# Patient Record
Sex: Male | Born: 1950
Health system: Southern US, Community
[De-identification: ages and names within clinical notes are randomized; demographics above are authoritative.]

## PROBLEM LIST (undated history)

## (undated) DIAGNOSIS — I10 Essential (primary) hypertension: Secondary | ICD-10-CM

## (undated) DIAGNOSIS — R972 Elevated prostate specific antigen [PSA]: Secondary | ICD-10-CM

## (undated) DIAGNOSIS — E7800 Pure hypercholesterolemia, unspecified: Secondary | ICD-10-CM

## (undated) DIAGNOSIS — M1990 Unspecified osteoarthritis, unspecified site: Secondary | ICD-10-CM

## (undated) DIAGNOSIS — C61 Malignant neoplasm of prostate: Secondary | ICD-10-CM

## (undated) DIAGNOSIS — J45909 Unspecified asthma, uncomplicated: Secondary | ICD-10-CM

## (undated) DIAGNOSIS — E781 Pure hyperglyceridemia: Secondary | ICD-10-CM

## (undated) HISTORY — PX: KNEE ARTHROSCOPY: SUR90

## (undated) HISTORY — DX: Pure hyperglyceridemia: E78.1

## (undated) HISTORY — DX: Essential (primary) hypertension: I10

## (undated) HISTORY — PX: CYST EXCISION: SHX5701

## (undated) HISTORY — PX: SHOULDER ARTHROSCOPY: SHX128

## (undated) HISTORY — PX: PROSTATE BIOPSY: SHX241

## (undated) HISTORY — PX: COLONOSCOPY: SHX174

---

## 2006-05-15 ENCOUNTER — Ambulatory Visit: Payer: Self-pay | Admitting: Hematology & Oncology

## 2016-09-26 ENCOUNTER — Ambulatory Visit (INDEPENDENT_AMBULATORY_CARE_PROVIDER_SITE_OTHER): Payer: Managed Care, Other (non HMO)

## 2016-09-26 ENCOUNTER — Other Ambulatory Visit: Payer: Self-pay | Admitting: Family Medicine

## 2016-09-26 DIAGNOSIS — M25551 Pain in right hip: Secondary | ICD-10-CM

## 2016-09-26 DIAGNOSIS — M11261 Other chondrocalcinosis, right knee: Secondary | ICD-10-CM | POA: Diagnosis not present

## 2016-09-26 DIAGNOSIS — I2510 Atherosclerotic heart disease of native coronary artery without angina pectoris: Secondary | ICD-10-CM

## 2016-09-26 DIAGNOSIS — I739 Peripheral vascular disease, unspecified: Secondary | ICD-10-CM | POA: Diagnosis not present

## 2016-09-26 DIAGNOSIS — M2341 Loose body in knee, right knee: Secondary | ICD-10-CM

## 2017-03-21 DIAGNOSIS — E669 Obesity, unspecified: Secondary | ICD-10-CM | POA: Diagnosis not present

## 2017-03-21 DIAGNOSIS — Z0000 Encounter for general adult medical examination without abnormal findings: Secondary | ICD-10-CM | POA: Diagnosis not present

## 2017-03-21 DIAGNOSIS — E782 Mixed hyperlipidemia: Secondary | ICD-10-CM | POA: Diagnosis not present

## 2017-03-21 DIAGNOSIS — Z1159 Encounter for screening for other viral diseases: Secondary | ICD-10-CM | POA: Diagnosis not present

## 2017-03-21 DIAGNOSIS — I10 Essential (primary) hypertension: Secondary | ICD-10-CM | POA: Diagnosis not present

## 2017-03-21 DIAGNOSIS — G4700 Insomnia, unspecified: Secondary | ICD-10-CM | POA: Diagnosis not present

## 2017-03-21 DIAGNOSIS — Z125 Encounter for screening for malignant neoplasm of prostate: Secondary | ICD-10-CM | POA: Diagnosis not present

## 2017-03-21 DIAGNOSIS — R7303 Prediabetes: Secondary | ICD-10-CM | POA: Diagnosis not present

## 2017-03-21 DIAGNOSIS — I700 Atherosclerosis of aorta: Secondary | ICD-10-CM | POA: Diagnosis not present

## 2017-03-21 DIAGNOSIS — Z23 Encounter for immunization: Secondary | ICD-10-CM | POA: Diagnosis not present

## 2017-03-21 DIAGNOSIS — J45909 Unspecified asthma, uncomplicated: Secondary | ICD-10-CM | POA: Diagnosis not present

## 2017-03-21 DIAGNOSIS — R809 Proteinuria, unspecified: Secondary | ICD-10-CM | POA: Diagnosis not present

## 2017-04-19 DIAGNOSIS — M25561 Pain in right knee: Secondary | ICD-10-CM | POA: Diagnosis not present

## 2017-05-10 DIAGNOSIS — R21 Rash and other nonspecific skin eruption: Secondary | ICD-10-CM | POA: Diagnosis not present

## 2017-09-25 DIAGNOSIS — J45909 Unspecified asthma, uncomplicated: Secondary | ICD-10-CM | POA: Diagnosis not present

## 2017-09-25 DIAGNOSIS — M2550 Pain in unspecified joint: Secondary | ICD-10-CM | POA: Diagnosis not present

## 2017-09-25 DIAGNOSIS — G4700 Insomnia, unspecified: Secondary | ICD-10-CM | POA: Diagnosis not present

## 2017-09-25 DIAGNOSIS — E669 Obesity, unspecified: Secondary | ICD-10-CM | POA: Diagnosis not present

## 2017-09-25 DIAGNOSIS — E782 Mixed hyperlipidemia: Secondary | ICD-10-CM | POA: Diagnosis not present

## 2017-09-25 DIAGNOSIS — I10 Essential (primary) hypertension: Secondary | ICD-10-CM | POA: Diagnosis not present

## 2017-09-25 DIAGNOSIS — R809 Proteinuria, unspecified: Secondary | ICD-10-CM | POA: Diagnosis not present

## 2017-11-16 ENCOUNTER — Encounter: Payer: Self-pay | Admitting: Endocrinology

## 2017-11-16 ENCOUNTER — Ambulatory Visit (INDEPENDENT_AMBULATORY_CARE_PROVIDER_SITE_OTHER): Payer: Medicare Other | Admitting: Endocrinology

## 2017-11-16 DIAGNOSIS — E781 Pure hyperglyceridemia: Secondary | ICD-10-CM | POA: Diagnosis not present

## 2017-11-16 DIAGNOSIS — I10 Essential (primary) hypertension: Secondary | ICD-10-CM

## 2017-11-16 MED ORDER — ATENOLOL 25 MG PO TABS: 25.0000 mg | ORAL_TABLET | Freq: Every day | ORAL | 3 refills | Status: DC

## 2017-11-16 MED ORDER — OLMESARTAN MEDOXOMIL 20 MG PO TABS: 20.0000 mg | ORAL_TABLET | Freq: Every day | ORAL | 11 refills | Status: DC

## 2017-11-16 NOTE — Patient Instructions (Addendum)
I have sent a prescription to your pharmacy, to reduce the atenolol, and add "olmesartan."   Please come back for a follow-up appointment in 1 month, fasting.       Food Choices to Lower Your Triglycerides Triglycerides are a type of fat in your blood. High levels of triglycerides can increase the risk of heart disease and stroke. If your triglyceride levels are high, the foods you eat and your eating habits are very important. Choosing the right foods can help lower your triglycerides. What general guidelines do I need to follow?  Lose weight if you are overweight.  Limit or avoid alcohol.  Fill one half of your plate with vegetables and green salads.  Limit fruit to two servings a day. Choose fruit instead of juice.  Make one fourth of your plate whole grains. Look for the word "whole" as the first word in the ingredient list.  Fill one fourth of your plate with lean protein foods.  Enjoy fatty fish (such as salmon, mackerel, sardines, and tuna) three times a week.  Choose healthy fats.  Limit foods high in starch and sugar.  Eat more home-cooked food and less restaurant, buffet, and fast food.  Limit fried foods.  Cook foods using methods other than frying.  Limit saturated fats.  Check ingredient lists to avoid foods with partially hydrogenated oils (trans fats) in them. What foods can I eat? Grains Whole grains, such as whole wheat or whole grain breads, crackers, cereals, and pasta. Unsweetened oatmeal, bulgur, barley, quinoa, or brown rice. Corn or whole wheat flour tortillas. Vegetables Fresh or frozen vegetables (raw, steamed, roasted, or grilled). Green salads. Fruits All fresh, canned (in natural juice), or frozen fruits. Meat and Other Protein Products Ground beef (85% or leaner), grass-fed beef, or beef trimmed of fat. Skinless chicken or [place]. Ground chicken or [place]. Pork trimmed of fat. All fish and seafood. Eggs. Dried beans, peas, or lentils. Unsalted  nuts or seeds. Unsalted canned or dry beans. Dairy Low-fat dairy products, such as skim or 1% milk, 2% or reduced-fat cheeses, low-fat ricotta or cottage cheese, or plain low-fat yogurt. Fats and Oils Tub margarines without trans fats. Light or reduced-fat mayonnaise and salad dressings. Avocado. Safflower, olive, or canola oils. Natural peanut or almond butter. The items listed above may not be a complete list of recommended foods or beverages. Contact your dietitian for more options. What foods are not recommended? Grains White bread. White pasta. White rice. Cornbread. Bagels, pastries, and croissants. Crackers that contain trans fat. Vegetables White potatoes. Corn. Creamed or fried vegetables. Vegetables in a cheese sauce. Fruits Dried fruits. Canned fruit in light or heavy syrup. Fruit juice. Meat and Other Protein Products Fatty cuts of meat. Ribs, chicken wings, bacon, sausage, bologna, salami, chitterlings, fatback, hot dogs, bratwurst, and packaged luncheon meats. Dairy Whole or 2% milk, cream, half-and-half, and cream cheese. Whole-fat or sweetened yogurt. Full-fat cheeses. Nondairy creamers and whipped toppings. Processed cheese, cheese spreads, or cheese curds. Sweets and Desserts Corn syrup, sugars, honey, and molasses. Candy. Jam and jelly. Syrup. Sweetened cereals. Cookies, pies, cakes, donuts, muffins, and ice cream. Fats and Oils Butter, stick margarine, lard, shortening, ghee, or bacon fat. Coconut, palm kernel, or palm oils. Beverages Alcohol. Sweetened drinks (such as sodas, lemonade, and fruit drinks or punches). The items listed above may not be a complete list of foods and beverages to avoid. Contact your dietitian for more information. This information is not intended to replace advice given to you by  your health care provider. Make sure you discuss any questions you have with your health care provider. Document Released: 08/11/2004 Document Revised: 03/31/2016  Document Reviewed: 08/28/2013 Elsevier Interactive Patient Education  2017 [employer].

## 2017-11-16 NOTE — Progress Notes (Signed)
Subjective:    Patient ID: Leonard Burke, male    DOB: 03-10-1951, 67 y.o.   MRN: [id number]  HPI Pt is a new pt, for hypertriglyceridemia.  This was dx'ed in approx 1985.  He denies h/o pancreatitis, diabetes, steroids, alcoholism, HIV, SLE, retin-A, or thyroid problems.  He has proteinuria.  He takes fenofibrate, and fish oil. He has moderate arthralgias throughout the body, but no assoc rash.  He did not tolerate non-prescription niacin, due to "burning of the skin."  Pt says Dr Doyle Askew tried to change BP rx for losartan, but he did not tolerate.   Past Medical History:  Diagnosis Date  . HTN (hypertension)   . Hypertriglyceridemia      Social History   Socioeconomic History  . Marital status: Married    Spouse name: Not on file  . Number of children: Not on file  . Years of education: Not on file  . Highest education level: Not on file  Social Needs  . Financial resource strain: Not on file  . Food insecurity - worry: Not on file  . Food insecurity - inability: Not on file  . Transportation needs - medical: Not on file  . Transportation needs - non-medical: Not on file  Occupational History  . Not on file  Tobacco Use  . Smoking status: Former Smoker    Types: Cigarettes  . Smokeless tobacco: Never Used  Substance and Sexual Activity  . Alcohol use: Yes    Frequency: Never    Comment: Occasional  . Drug use: No  . Sexual activity: No  Other Topics Concern  . Not on file  Social History Narrative  . Not on file    Current Outpatient Medications on File Prior to Visit  Medication Sig Dispense Refill  . amLODipine (NORVASC) 10 MG tablet Take 10 mg by mouth daily.    . Choline Fenofibrate (FENOFIBRIC ACID) 135 MG CPDR Take 135 mg by mouth daily.    . hydrochlorothiazide (MICROZIDE) 12.5 MG capsule Take 12.5 mg by mouth daily.  0  . VENTOLIN HFA 108 (90 Base) MCG/ACT inhaler Inhale 90 mcg into the lungs as needed.  1   No current facility-administered  medications on file prior to visit.     Allergies  Allergen Reactions  . Codeine Nausea And Vomiting  . Penicillins Rash    Family History  Problem Relation Age of Onset  . Other Neg Hx        hypertriglyceridemia    BP 126/72 (BP Location: Left Arm, Patient Position: Sitting, Cuff Size: Normal)   Pulse 65   Wt 238 lb 9.6 oz (108.2 kg)   SpO2 96%    Review of Systems denies weight loss, blurry vision, headache, chest pain, sob, n/v, urinary frequency, muscle cramps, excessive diaphoresis, memory loss, depression, cold intolerance, rhinorrhea, and easy bruising.      Objective:   Physical Exam VS: see vs page GEN: no distress HEAD: head: no deformity eyes: no periorbital swelling, no proptosis external nose and ears are normal mouth: no lesion seen NECK: supple, thyroid is not enlarged CHEST WALL: no deformity LUNGS: clear to auscultation CV: reg rate and rhythm, no murmur ABD: abdomen is soft, nontender.  no hepatosplenomegaly.  not distended.  no hernia MUSCULOSKELETAL: muscle bulk and strength are grossly normal.  no obvious joint swelling.  gait is normal and steady EXTEMITIES: no deformity.  1+ bilat leg edema PULSES: no carotid bruit NEURO:  cn 2-12 grossly intact.  readily moves all 4's.  sensation is intact to touch on all 4's SKIN:  Normal texture and temperature.  No rash or suspicious lesion is visible.  No cutaneous stigmata are seen NODES:  None palpable at the neck.  PSYCH: alert, well-oriented.  Does not appear anxious nor depressed.   I have reviewed outside records, and summarized: Pt was noted to have elevated TG, and referred here.  He takes several BP meds which affect TG and edema, but no alternative was available.    outside test results are reviewed: TG=1581 LDL=56    Assessment & Plan:  Hypertriglyceridemia, new to me. We discussed.  He says he dopes not have time to see a dietician Obesity: he declines surgery. HTN: Alternatives to  B-blocker and HCTZ should be considered if possible.   Edema: this is a relative contraindication to pioglitizone.  Patient Instructions  I have sent a prescription to your pharmacy, to reduce the atenolol, and add "olmesartan."   Please come back for a follow-up appointment in 1 month, fasting.       Food Choices to Lower Your Triglycerides Triglycerides are a type of fat in your blood. High levels of triglycerides can increase the risk of heart disease and stroke. If your triglyceride levels are high, the foods you eat and your eating habits are very important. Choosing the right foods can help lower your triglycerides. What general guidelines do I need to follow?  Lose weight if you are overweight.  Limit or avoid alcohol.  Fill one half of your plate with vegetables and green salads.  Limit fruit to two servings a day. Choose fruit instead of juice.  Make one fourth of your plate whole grains. Look for the word "whole" as the first word in the ingredient list.  Fill one fourth of your plate with lean protein foods.  Enjoy fatty fish (such as salmon, mackerel, sardines, and tuna) three times a week.  Choose healthy fats.  Limit foods high in starch and sugar.  Eat more home-cooked food and less restaurant, buffet, and fast food.  Limit fried foods.  Cook foods using methods other than frying.  Limit saturated fats.  Check ingredient lists to avoid foods with partially hydrogenated oils (trans fats) in them. What foods can I eat? Grains Whole grains, such as whole wheat or whole grain breads, crackers, cereals, and pasta. Unsweetened oatmeal, bulgur, barley, quinoa, or brown rice. Corn or whole wheat flour tortillas. Vegetables Fresh or frozen vegetables (raw, steamed, roasted, or grilled). Green salads. Fruits All fresh, canned (in natural juice), or frozen fruits. Meat and Other Protein Products Ground beef (85% or leaner), grass-fed beef, or beef trimmed of fat.  Skinless chicken or [place]. Ground chicken or [place]. Pork trimmed of fat. All fish and seafood. Eggs. Dried beans, peas, or lentils. Unsalted nuts or seeds. Unsalted canned or dry beans. Dairy Low-fat dairy products, such as skim or 1% milk, 2% or reduced-fat cheeses, low-fat ricotta or cottage cheese, or plain low-fat yogurt. Fats and Oils Tub margarines without trans fats. Light or reduced-fat mayonnaise and salad dressings. Avocado. Safflower, olive, or canola oils. Natural peanut or almond butter. The items listed above may not be a complete list of recommended foods or beverages. Contact your dietitian for more options. What foods are not recommended? Grains White bread. White pasta. White rice. Cornbread. Bagels, pastries, and croissants. Crackers that contain trans fat. Vegetables White potatoes. Corn. Creamed or fried vegetables. Vegetables in a cheese sauce. Fruits Dried fruits.  Canned fruit in light or heavy syrup. Fruit juice. Meat and Other Protein Products Fatty cuts of meat. Ribs, chicken wings, bacon, sausage, bologna, salami, chitterlings, fatback, hot dogs, bratwurst, and packaged luncheon meats. Dairy Whole or 2% milk, cream, half-and-half, and cream cheese. Whole-fat or sweetened yogurt. Full-fat cheeses. Nondairy creamers and whipped toppings. Processed cheese, cheese spreads, or cheese curds. Sweets and Desserts Corn syrup, sugars, honey, and molasses. Candy. Jam and jelly. Syrup. Sweetened cereals. Cookies, pies, cakes, donuts, muffins, and ice cream. Fats and Oils Butter, stick margarine, lard, shortening, ghee, or bacon fat. Coconut, palm kernel, or palm oils. Beverages Alcohol. Sweetened drinks (such as sodas, lemonade, and fruit drinks or punches). The items listed above may not be a complete list of foods and beverages to avoid. Contact your dietitian for more information. This information is not intended to replace advice given to you by your health care provider.  Make sure you discuss any questions you have with your health care provider. Document Released: 08/11/2004 Document Revised: 03/31/2016 Document Reviewed: 08/28/2013 Elsevier Interactive Patient Education  2017 [employer].

## 2017-11-18 ENCOUNTER — Encounter: Payer: Self-pay | Admitting: Endocrinology

## 2017-11-18 DIAGNOSIS — I10 Essential (primary) hypertension: Secondary | ICD-10-CM | POA: Insufficient documentation

## 2017-11-18 DIAGNOSIS — E781 Pure hyperglyceridemia: Secondary | ICD-10-CM | POA: Insufficient documentation

## 2017-12-05 ENCOUNTER — Telehealth: Payer: Self-pay | Admitting: Endocrinology

## 2017-12-05 NOTE — Telephone Encounter (Signed)
LM for pt to get a PCP MD on file and the referring MD on file.

## 2017-12-05 NOTE — Telephone Encounter (Signed)
-----   Message from Renato Shin, MD sent at 11/18/2017  5:30 PM EST ----- I can't find a ref or PCP, thanks.

## 2017-12-18 ENCOUNTER — Encounter: Payer: Self-pay | Admitting: Endocrinology

## 2017-12-18 ENCOUNTER — Ambulatory Visit (INDEPENDENT_AMBULATORY_CARE_PROVIDER_SITE_OTHER): Payer: Medicare Other | Admitting: Endocrinology

## 2017-12-18 VITALS — BP 122/71 | HR 62 | Wt 235.6 lb

## 2017-12-18 DIAGNOSIS — E781 Pure hyperglyceridemia: Secondary | ICD-10-CM

## 2017-12-18 DIAGNOSIS — M542 Cervicalgia: Secondary | ICD-10-CM | POA: Diagnosis not present

## 2017-12-18 DIAGNOSIS — M25512 Pain in left shoulder: Secondary | ICD-10-CM | POA: Diagnosis not present

## 2017-12-18 LAB — TSH: TSH: 4.28 u[IU]/mL (ref 0.35–4.50)

## 2017-12-18 LAB — LDL CHOLESTEROL, DIRECT: Direct LDL: 88 mg/dL

## 2017-12-18 LAB — LIPID PANEL
CHOL/HDL RATIO: 9
CHOLESTEROL: 215 mg/dL — AB (ref 0–200)
HDL: 24.2 mg/dL — AB (ref >39.00)

## 2017-12-18 MED ORDER — ICOSAPENT ETHYL 1 G PO CAPS: 2.0000 g | ORAL_CAPSULE | Freq: Every day | ORAL | 11 refills | Status: DC

## 2017-12-18 NOTE — Patient Instructions (Addendum)
Please verify that this medication list is correct.  blood tests are requested for you today.  We'll let you know about the results. If we need to, we can add "Vascepa."

## 2017-12-18 NOTE — Progress Notes (Signed)
Subjective:    Patient ID: Leonard Burke, male    DOB: Mar 06, 1951, 67 y.o.   MRN: [id number]  HPI Pt returns for f/u of hypertriglyceridemia (dx'ed 1985; he has no h/o pancreatitis or ASCVD; only causes found were obesity (declines surgery) and BP meds; he did not tolerate non-prescription niacin; edema is a relative contraindication to pioglitizone; he takes fenofibrate).  He is uncertain if he made changed in BP meds made at last ov here. Past Medical History:  Diagnosis Date  . HTN (hypertension)   . Hypertriglyceridemia     History reviewed. No pertinent surgical history.  Social History   Socioeconomic History  . Marital status: Married    Spouse name: Not on file  . Number of children: Not on file  . Years of education: Not on file  . Highest education level: Not on file  Social Needs  . Financial resource strain: Not on file  . Food insecurity - worry: Not on file  . Food insecurity - inability: Not on file  . Transportation needs - medical: Not on file  . Transportation needs - non-medical: Not on file  Occupational History  . Not on file  Tobacco Use  . Smoking status: Former Smoker    Types: Cigarettes  . Smokeless tobacco: Never Used  Substance and Sexual Activity  . Alcohol use: Yes    Frequency: Never    Comment: Occasional  . Drug use: No  . Sexual activity: No  Other Topics Concern  . Not on file  Social History Narrative  . Not on file    Current Outpatient Medications on File Prior to Visit  Medication Sig Dispense Refill  . amLODipine (NORVASC) 10 MG tablet Take 10 mg by mouth daily.    Marland Kitchen atenolol (TENORMIN) 25 MG tablet Take 1 tablet (25 mg total) by mouth daily. 30 tablet 3  . Choline Fenofibrate (FENOFIBRIC ACID) 135 MG CPDR Take 135 mg by mouth daily.    . hydrochlorothiazide (MICROZIDE) 12.5 MG capsule Take 12.5 mg by mouth daily.  0  . olmesartan (BENICAR) 20 MG tablet Take 1 tablet (20 mg total) by mouth daily. 30 tablet 11  .  VENTOLIN HFA 108 (90 Base) MCG/ACT inhaler Inhale 90 mcg into the lungs as needed.  1   No current facility-administered medications on file prior to visit.     Allergies  Allergen Reactions  . Codeine Nausea And Vomiting  . Penicillins Rash    Family History  Problem Relation Age of Onset  . Other Neg Hx        hypertriglyceridemia    BP 122/71 (BP Location: Left Arm, Patient Position: Sitting, Cuff Size: Normal)   Pulse 62   Wt 235 lb 9.6 oz (106.9 kg)   SpO2 95%    Review of Systems Denies dizziness.     Objective:   Physical Exam VITAL SIGNS:  See vs page GENERAL: no distress Ext: 1+ bilat leg edema.    Lab Results  Component Value Date   CHOL 215 (H) 12/18/2017   HDL 24.20 (L) 12/18/2017   LDLDIRECT 88.0 12/18/2017   TRIG (H) 12/18/2017    616.0 Triglyceride is over 400; calculations on Lipids are invalid.   CHOLHDL 9 12/18/2017      Assessment & Plan:  Hypertriglyceridemia, improved but persistent HTN: well-controlled, but pt is uncertain about his meds.  Patient Instructions  Please verify that this medication list is correct.  blood tests are requested for you today.  We'll let you know about the results. If we need to, we can add "Vascepa."

## 2017-12-19 ENCOUNTER — Telehealth: Payer: Self-pay | Admitting: Endocrinology

## 2017-12-19 NOTE — Telephone Encounter (Signed)
I called patient LVM informing him of lab results & to call back to make one month lab appt for fasting labs.

## 2017-12-19 NOTE — Telephone Encounter (Signed)
Pt is returning call about blood test.   pt said okay to leave message if he does not answer,

## 2018-01-19 ENCOUNTER — Other Ambulatory Visit: Payer: Self-pay

## 2018-01-24 ENCOUNTER — Telehealth: Payer: Self-pay | Admitting: Endocrinology

## 2018-01-24 ENCOUNTER — Other Ambulatory Visit (INDEPENDENT_AMBULATORY_CARE_PROVIDER_SITE_OTHER): Payer: Medicare Other

## 2018-01-24 DIAGNOSIS — E781 Pure hyperglyceridemia: Secondary | ICD-10-CM | POA: Diagnosis not present

## 2018-01-24 LAB — LIPID PANEL
Cholesterol: 220 mg/dL — ABNORMAL HIGH (ref 0–200)
HDL: 30.2 mg/dL — AB (ref >39.00)
Total CHOL/HDL Ratio: 7

## 2018-01-24 NOTE — Telephone Encounter (Signed)
Patient states insurance has changed-Medication Fenofibric Acid has gone up to $112.00 for 30 pills. Is there a generic or something similar that can be prescribed for him? Pharmacy is [facility] in [place]. Please contact patient to let him know.

## 2018-01-24 NOTE — Telephone Encounter (Signed)
See message and please advise, Thanks!  

## 2018-01-24 NOTE — Telephone Encounter (Signed)
there are many strengths of fenofibrate.  I just need to know which medicare prefers?

## 2018-01-25 ENCOUNTER — Other Ambulatory Visit: Payer: Self-pay | Admitting: Endocrinology

## 2018-01-25 DIAGNOSIS — E781 Pure hyperglyceridemia: Secondary | ICD-10-CM

## 2018-01-25 LAB — LDL CHOLESTEROL, DIRECT: Direct LDL: 94 mg/dL

## 2018-01-25 MED ORDER — FENOFIBRATE 145 MG PO TABS: 145.0000 mg | ORAL_TABLET | Freq: Every day | ORAL | 3 refills | Status: AC

## 2018-01-25 NOTE — Telephone Encounter (Signed)
Ok, I have sent a prescription to your pharmacy 

## 2018-01-25 NOTE — Telephone Encounter (Signed)
fenofibrate oral tablet 145 mg, 48 mg Tier 2 RM-Retail or Mail Order Pharmacy fenofibric acid oral capsule delayed release 135 mg, 45 mg  Tier 2 RM-Retail or Mail Order Pharmacy  This is from the 2019 Medicare Formulary

## 2018-02-28 ENCOUNTER — Other Ambulatory Visit: Payer: Medicare Other

## 2018-03-28 DIAGNOSIS — Z0000 Encounter for general adult medical examination without abnormal findings: Secondary | ICD-10-CM | POA: Diagnosis not present

## 2018-03-28 DIAGNOSIS — Z125 Encounter for screening for malignant neoplasm of prostate: Secondary | ICD-10-CM | POA: Diagnosis not present

## 2018-03-28 DIAGNOSIS — G4700 Insomnia, unspecified: Secondary | ICD-10-CM | POA: Diagnosis not present

## 2018-03-28 DIAGNOSIS — I10 Essential (primary) hypertension: Secondary | ICD-10-CM | POA: Diagnosis not present

## 2018-03-28 DIAGNOSIS — J45909 Unspecified asthma, uncomplicated: Secondary | ICD-10-CM | POA: Diagnosis not present

## 2018-03-28 DIAGNOSIS — E781 Pure hyperglyceridemia: Secondary | ICD-10-CM | POA: Diagnosis not present

## 2018-03-28 DIAGNOSIS — R809 Proteinuria, unspecified: Secondary | ICD-10-CM | POA: Diagnosis not present

## 2018-03-28 DIAGNOSIS — E782 Mixed hyperlipidemia: Secondary | ICD-10-CM | POA: Diagnosis not present

## 2018-03-28 DIAGNOSIS — Z6834 Body mass index (BMI) 34.0-34.9, adult: Secondary | ICD-10-CM | POA: Diagnosis not present

## 2018-03-28 DIAGNOSIS — E669 Obesity, unspecified: Secondary | ICD-10-CM | POA: Diagnosis not present

## 2018-05-09 DIAGNOSIS — M545 Low back pain: Secondary | ICD-10-CM | POA: Diagnosis not present

## 2018-05-09 DIAGNOSIS — M7582 Other shoulder lesions, left shoulder: Secondary | ICD-10-CM | POA: Diagnosis not present

## 2018-05-09 DIAGNOSIS — Z6836 Body mass index (BMI) 36.0-36.9, adult: Secondary | ICD-10-CM | POA: Diagnosis not present

## 2018-05-14 ENCOUNTER — Other Ambulatory Visit: Payer: Self-pay | Admitting: Endocrinology

## 2018-05-29 ENCOUNTER — Other Ambulatory Visit (INDEPENDENT_AMBULATORY_CARE_PROVIDER_SITE_OTHER): Payer: Medicare Other

## 2018-05-29 DIAGNOSIS — E781 Pure hyperglyceridemia: Secondary | ICD-10-CM

## 2018-05-29 DIAGNOSIS — H9113 Presbycusis, bilateral: Secondary | ICD-10-CM | POA: Diagnosis not present

## 2018-05-29 DIAGNOSIS — H9313 Tinnitus, bilateral: Secondary | ICD-10-CM | POA: Diagnosis not present

## 2018-05-29 DIAGNOSIS — H903 Sensorineural hearing loss, bilateral: Secondary | ICD-10-CM | POA: Diagnosis not present

## 2018-05-29 DIAGNOSIS — H833X3 Noise effects on inner ear, bilateral: Secondary | ICD-10-CM | POA: Diagnosis not present

## 2018-05-29 LAB — LIPID PANEL
CHOL/HDL RATIO: 7
CHOLESTEROL: 209 mg/dL — AB (ref 0–200)
HDL: 31.2 mg/dL — ABNORMAL LOW (ref >39.00)
NonHDL: 178.01
Triglycerides: 330 mg/dL — ABNORMAL HIGH (ref 0.0–149.0)
VLDL: 66 mg/dL — AB (ref 0.0–40.0)

## 2018-05-29 LAB — LDL CHOLESTEROL, DIRECT: Direct LDL: 119 mg/dL

## 2018-06-19 ENCOUNTER — Telehealth: Payer: Self-pay | Admitting: Endocrinology

## 2018-06-19 NOTE — Telephone Encounter (Signed)
Please advise on below  

## 2018-06-19 NOTE — Telephone Encounter (Signed)
Aida [phone] called from [facility] regarding pt stating that Dr Loanne Drilling suggested to have Dr Olen Pel reduse pt BP meds. The suggestion if so needs to be put in a letter. Medication is atenolol (TENORMIN) 25 MG tablet and hydrochlorothiazide (MICROZIDE) 12.5 MG capsule.  Please fax to [phone]. Thanks

## 2018-06-20 NOTE — Telephone Encounter (Signed)
done

## 2018-07-05 DIAGNOSIS — D485 Neoplasm of uncertain behavior of skin: Secondary | ICD-10-CM | POA: Diagnosis not present

## 2018-07-19 DIAGNOSIS — L570 Actinic keratosis: Secondary | ICD-10-CM | POA: Diagnosis not present

## 2018-08-16 ENCOUNTER — Other Ambulatory Visit: Payer: Self-pay | Admitting: Endocrinology

## 2018-08-26 DIAGNOSIS — Z23 Encounter for immunization: Secondary | ICD-10-CM | POA: Diagnosis not present

## 2018-08-28 DIAGNOSIS — L570 Actinic keratosis: Secondary | ICD-10-CM | POA: Diagnosis not present

## 2018-09-01 DIAGNOSIS — M1712 Unilateral primary osteoarthritis, left knee: Secondary | ICD-10-CM | POA: Diagnosis not present

## 2018-09-25 DIAGNOSIS — L570 Actinic keratosis: Secondary | ICD-10-CM | POA: Diagnosis not present

## 2018-09-28 DIAGNOSIS — I700 Atherosclerosis of aorta: Secondary | ICD-10-CM | POA: Diagnosis not present

## 2018-09-28 DIAGNOSIS — G4700 Insomnia, unspecified: Secondary | ICD-10-CM | POA: Diagnosis not present

## 2018-09-28 DIAGNOSIS — E782 Mixed hyperlipidemia: Secondary | ICD-10-CM | POA: Diagnosis not present

## 2018-09-28 DIAGNOSIS — R809 Proteinuria, unspecified: Secondary | ICD-10-CM | POA: Diagnosis not present

## 2018-09-28 DIAGNOSIS — J45909 Unspecified asthma, uncomplicated: Secondary | ICD-10-CM | POA: Diagnosis not present

## 2018-09-28 DIAGNOSIS — E669 Obesity, unspecified: Secondary | ICD-10-CM | POA: Diagnosis not present

## 2018-09-28 DIAGNOSIS — I10 Essential (primary) hypertension: Secondary | ICD-10-CM | POA: Diagnosis not present

## 2018-11-13 DIAGNOSIS — M1712 Unilateral primary osteoarthritis, left knee: Secondary | ICD-10-CM | POA: Diagnosis not present

## 2018-11-13 DIAGNOSIS — M25512 Pain in left shoulder: Secondary | ICD-10-CM | POA: Diagnosis not present

## 2018-11-23 ENCOUNTER — Encounter (HOSPITAL_BASED_OUTPATIENT_CLINIC_OR_DEPARTMENT_OTHER): Payer: Self-pay

## 2018-11-23 ENCOUNTER — Other Ambulatory Visit: Payer: Self-pay

## 2018-11-23 ENCOUNTER — Emergency Department (HOSPITAL_BASED_OUTPATIENT_CLINIC_OR_DEPARTMENT_OTHER)
Admission: EM | Admit: 2018-11-23 | Discharge: 2018-11-23 | Disposition: A | Discharge status: Home / Self Care | Payer: No Typology Code available for payment source | Attending: Emergency Medicine | Admitting: Emergency Medicine

## 2018-11-23 ENCOUNTER — Emergency Department (HOSPITAL_BASED_OUTPATIENT_CLINIC_OR_DEPARTMENT_OTHER): Payer: No Typology Code available for payment source

## 2018-11-23 DIAGNOSIS — Z79899 Other long term (current) drug therapy: Secondary | ICD-10-CM | POA: Diagnosis not present

## 2018-11-23 DIAGNOSIS — I10 Essential (primary) hypertension: Secondary | ICD-10-CM | POA: Diagnosis not present

## 2018-11-23 DIAGNOSIS — Z87891 Personal history of nicotine dependence: Secondary | ICD-10-CM | POA: Insufficient documentation

## 2018-11-23 DIAGNOSIS — M542 Cervicalgia: Secondary | ICD-10-CM | POA: Diagnosis not present

## 2018-11-23 DIAGNOSIS — S199XXA Unspecified injury of neck, initial encounter: Secondary | ICD-10-CM | POA: Diagnosis not present

## 2018-11-23 DIAGNOSIS — Z23 Encounter for immunization: Secondary | ICD-10-CM | POA: Insufficient documentation

## 2018-11-23 DIAGNOSIS — R040 Epistaxis: Secondary | ICD-10-CM | POA: Insufficient documentation

## 2018-11-23 DIAGNOSIS — S0990XA Unspecified injury of head, initial encounter: Secondary | ICD-10-CM | POA: Diagnosis not present

## 2018-11-23 MED ORDER — TETANUS-DIPHTH-ACELL PERTUSSIS 5-2.5-18.5 LF-MCG/0.5 IM SUSP
0.5000 mL | Freq: Once | INTRAMUSCULAR | Status: AC
  Administered 2018-11-23: 0.5 mL via INTRAMUSCULAR
  Filled 2018-11-23: qty 0.5
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
## 2019-03-28 DIAGNOSIS — M170 Bilateral primary osteoarthritis of knee: Secondary | ICD-10-CM | POA: Diagnosis not present

## 2019-04-08 DIAGNOSIS — M170 Bilateral primary osteoarthritis of knee: Secondary | ICD-10-CM | POA: Diagnosis not present

## 2019-04-15 DIAGNOSIS — M170 Bilateral primary osteoarthritis of knee: Secondary | ICD-10-CM | POA: Diagnosis not present

## 2019-06-01 DIAGNOSIS — M9903 Segmental and somatic dysfunction of lumbar region: Secondary | ICD-10-CM | POA: Diagnosis not present

## 2019-06-01 DIAGNOSIS — M5136 Other intervertebral disc degeneration, lumbar region: Secondary | ICD-10-CM | POA: Diagnosis not present

## 2019-06-01 DIAGNOSIS — M5135 Other intervertebral disc degeneration, thoracolumbar region: Secondary | ICD-10-CM | POA: Diagnosis not present

## 2019-06-01 DIAGNOSIS — M9902 Segmental and somatic dysfunction of thoracic region: Secondary | ICD-10-CM | POA: Diagnosis not present

## 2019-06-01 DIAGNOSIS — M9904 Segmental and somatic dysfunction of sacral region: Secondary | ICD-10-CM | POA: Diagnosis not present

## 2019-06-01 DIAGNOSIS — M5137 Other intervertebral disc degeneration, lumbosacral region: Secondary | ICD-10-CM | POA: Diagnosis not present

## 2019-06-08 DIAGNOSIS — M5135 Other intervertebral disc degeneration, thoracolumbar region: Secondary | ICD-10-CM | POA: Diagnosis not present

## 2019-06-08 DIAGNOSIS — M5136 Other intervertebral disc degeneration, lumbar region: Secondary | ICD-10-CM | POA: Diagnosis not present

## 2019-06-08 DIAGNOSIS — M5137 Other intervertebral disc degeneration, lumbosacral region: Secondary | ICD-10-CM | POA: Diagnosis not present

## 2019-06-08 DIAGNOSIS — M9903 Segmental and somatic dysfunction of lumbar region: Secondary | ICD-10-CM | POA: Diagnosis not present

## 2019-06-08 DIAGNOSIS — M9902 Segmental and somatic dysfunction of thoracic region: Secondary | ICD-10-CM | POA: Diagnosis not present

## 2019-06-08 DIAGNOSIS — M9904 Segmental and somatic dysfunction of sacral region: Secondary | ICD-10-CM | POA: Diagnosis not present

## 2019-06-15 DIAGNOSIS — M5137 Other intervertebral disc degeneration, lumbosacral region: Secondary | ICD-10-CM | POA: Diagnosis not present

## 2019-06-15 DIAGNOSIS — M9904 Segmental and somatic dysfunction of sacral region: Secondary | ICD-10-CM | POA: Diagnosis not present

## 2019-06-15 DIAGNOSIS — M5136 Other intervertebral disc degeneration, lumbar region: Secondary | ICD-10-CM | POA: Diagnosis not present

## 2019-06-15 DIAGNOSIS — M9902 Segmental and somatic dysfunction of thoracic region: Secondary | ICD-10-CM | POA: Diagnosis not present

## 2019-06-15 DIAGNOSIS — M9903 Segmental and somatic dysfunction of lumbar region: Secondary | ICD-10-CM | POA: Diagnosis not present

## 2019-06-15 DIAGNOSIS — M5135 Other intervertebral disc degeneration, thoracolumbar region: Secondary | ICD-10-CM | POA: Diagnosis not present

## 2019-06-22 DIAGNOSIS — M5137 Other intervertebral disc degeneration, lumbosacral region: Secondary | ICD-10-CM | POA: Diagnosis not present

## 2019-06-22 DIAGNOSIS — M5136 Other intervertebral disc degeneration, lumbar region: Secondary | ICD-10-CM | POA: Diagnosis not present

## 2019-06-22 DIAGNOSIS — M5135 Other intervertebral disc degeneration, thoracolumbar region: Secondary | ICD-10-CM | POA: Diagnosis not present

## 2019-06-22 DIAGNOSIS — M9902 Segmental and somatic dysfunction of thoracic region: Secondary | ICD-10-CM | POA: Diagnosis not present

## 2019-06-22 DIAGNOSIS — M9903 Segmental and somatic dysfunction of lumbar region: Secondary | ICD-10-CM | POA: Diagnosis not present

## 2019-06-22 DIAGNOSIS — M9904 Segmental and somatic dysfunction of sacral region: Secondary | ICD-10-CM | POA: Diagnosis not present

## 2019-07-03 DIAGNOSIS — Z125 Encounter for screening for malignant neoplasm of prostate: Secondary | ICD-10-CM | POA: Diagnosis not present

## 2019-07-03 DIAGNOSIS — M9903 Segmental and somatic dysfunction of lumbar region: Secondary | ICD-10-CM | POA: Diagnosis not present

## 2019-07-03 DIAGNOSIS — I700 Atherosclerosis of aorta: Secondary | ICD-10-CM | POA: Diagnosis not present

## 2019-07-03 DIAGNOSIS — E782 Mixed hyperlipidemia: Secondary | ICD-10-CM | POA: Diagnosis not present

## 2019-07-03 DIAGNOSIS — M9904 Segmental and somatic dysfunction of sacral region: Secondary | ICD-10-CM | POA: Diagnosis not present

## 2019-07-03 DIAGNOSIS — G4700 Insomnia, unspecified: Secondary | ICD-10-CM | POA: Diagnosis not present

## 2019-07-03 DIAGNOSIS — M5137 Other intervertebral disc degeneration, lumbosacral region: Secondary | ICD-10-CM | POA: Diagnosis not present

## 2019-07-03 DIAGNOSIS — M5136 Other intervertebral disc degeneration, lumbar region: Secondary | ICD-10-CM | POA: Diagnosis not present

## 2019-07-03 DIAGNOSIS — M5135 Other intervertebral disc degeneration, thoracolumbar region: Secondary | ICD-10-CM | POA: Diagnosis not present

## 2019-07-03 DIAGNOSIS — M1990 Unspecified osteoarthritis, unspecified site: Secondary | ICD-10-CM | POA: Diagnosis not present

## 2019-07-03 DIAGNOSIS — M9902 Segmental and somatic dysfunction of thoracic region: Secondary | ICD-10-CM | POA: Diagnosis not present

## 2019-07-03 DIAGNOSIS — I10 Essential (primary) hypertension: Secondary | ICD-10-CM | POA: Diagnosis not present

## 2019-07-03 DIAGNOSIS — J45909 Unspecified asthma, uncomplicated: Secondary | ICD-10-CM | POA: Diagnosis not present

## 2019-07-06 DIAGNOSIS — M9903 Segmental and somatic dysfunction of lumbar region: Secondary | ICD-10-CM | POA: Diagnosis not present

## 2019-07-06 DIAGNOSIS — M9902 Segmental and somatic dysfunction of thoracic region: Secondary | ICD-10-CM | POA: Diagnosis not present

## 2019-07-06 DIAGNOSIS — M5137 Other intervertebral disc degeneration, lumbosacral region: Secondary | ICD-10-CM | POA: Diagnosis not present

## 2019-07-06 DIAGNOSIS — M5136 Other intervertebral disc degeneration, lumbar region: Secondary | ICD-10-CM | POA: Diagnosis not present

## 2019-07-06 DIAGNOSIS — M5135 Other intervertebral disc degeneration, thoracolumbar region: Secondary | ICD-10-CM | POA: Diagnosis not present

## 2019-07-06 DIAGNOSIS — M9904 Segmental and somatic dysfunction of sacral region: Secondary | ICD-10-CM | POA: Diagnosis not present

## 2019-07-20 DIAGNOSIS — M5136 Other intervertebral disc degeneration, lumbar region: Secondary | ICD-10-CM | POA: Diagnosis not present

## 2019-07-20 DIAGNOSIS — M5137 Other intervertebral disc degeneration, lumbosacral region: Secondary | ICD-10-CM | POA: Diagnosis not present

## 2019-07-20 DIAGNOSIS — M9904 Segmental and somatic dysfunction of sacral region: Secondary | ICD-10-CM | POA: Diagnosis not present

## 2019-07-20 DIAGNOSIS — M5135 Other intervertebral disc degeneration, thoracolumbar region: Secondary | ICD-10-CM | POA: Diagnosis not present

## 2019-07-20 DIAGNOSIS — M9903 Segmental and somatic dysfunction of lumbar region: Secondary | ICD-10-CM | POA: Diagnosis not present

## 2019-07-20 DIAGNOSIS — M9902 Segmental and somatic dysfunction of thoracic region: Secondary | ICD-10-CM | POA: Diagnosis not present

## 2019-07-27 DIAGNOSIS — M5136 Other intervertebral disc degeneration, lumbar region: Secondary | ICD-10-CM | POA: Diagnosis not present

## 2019-07-27 DIAGNOSIS — M9902 Segmental and somatic dysfunction of thoracic region: Secondary | ICD-10-CM | POA: Diagnosis not present

## 2019-07-27 DIAGNOSIS — M9904 Segmental and somatic dysfunction of sacral region: Secondary | ICD-10-CM | POA: Diagnosis not present

## 2019-07-27 DIAGNOSIS — M9903 Segmental and somatic dysfunction of lumbar region: Secondary | ICD-10-CM | POA: Diagnosis not present

## 2019-07-27 DIAGNOSIS — M5137 Other intervertebral disc degeneration, lumbosacral region: Secondary | ICD-10-CM | POA: Diagnosis not present

## 2019-07-27 DIAGNOSIS — M5135 Other intervertebral disc degeneration, thoracolumbar region: Secondary | ICD-10-CM | POA: Diagnosis not present

## 2019-08-03 DIAGNOSIS — M9902 Segmental and somatic dysfunction of thoracic region: Secondary | ICD-10-CM | POA: Diagnosis not present

## 2019-08-03 DIAGNOSIS — M5136 Other intervertebral disc degeneration, lumbar region: Secondary | ICD-10-CM | POA: Diagnosis not present

## 2019-08-03 DIAGNOSIS — M5137 Other intervertebral disc degeneration, lumbosacral region: Secondary | ICD-10-CM | POA: Diagnosis not present

## 2019-08-03 DIAGNOSIS — M9903 Segmental and somatic dysfunction of lumbar region: Secondary | ICD-10-CM | POA: Diagnosis not present

## 2019-08-03 DIAGNOSIS — M5135 Other intervertebral disc degeneration, thoracolumbar region: Secondary | ICD-10-CM | POA: Diagnosis not present

## 2019-08-03 DIAGNOSIS — M9904 Segmental and somatic dysfunction of sacral region: Secondary | ICD-10-CM | POA: Diagnosis not present

## 2019-08-17 DIAGNOSIS — M5136 Other intervertebral disc degeneration, lumbar region: Secondary | ICD-10-CM | POA: Diagnosis not present

## 2019-08-17 DIAGNOSIS — M9904 Segmental and somatic dysfunction of sacral region: Secondary | ICD-10-CM | POA: Diagnosis not present

## 2019-08-17 DIAGNOSIS — M5137 Other intervertebral disc degeneration, lumbosacral region: Secondary | ICD-10-CM | POA: Diagnosis not present

## 2019-08-17 DIAGNOSIS — M5135 Other intervertebral disc degeneration, thoracolumbar region: Secondary | ICD-10-CM | POA: Diagnosis not present

## 2019-08-17 DIAGNOSIS — M9902 Segmental and somatic dysfunction of thoracic region: Secondary | ICD-10-CM | POA: Diagnosis not present

## 2019-08-17 DIAGNOSIS — M9903 Segmental and somatic dysfunction of lumbar region: Secondary | ICD-10-CM | POA: Diagnosis not present

## 2019-08-24 DIAGNOSIS — M9904 Segmental and somatic dysfunction of sacral region: Secondary | ICD-10-CM | POA: Diagnosis not present

## 2019-08-24 DIAGNOSIS — M5137 Other intervertebral disc degeneration, lumbosacral region: Secondary | ICD-10-CM | POA: Diagnosis not present

## 2019-08-24 DIAGNOSIS — M5135 Other intervertebral disc degeneration, thoracolumbar region: Secondary | ICD-10-CM | POA: Diagnosis not present

## 2019-08-24 DIAGNOSIS — M9902 Segmental and somatic dysfunction of thoracic region: Secondary | ICD-10-CM | POA: Diagnosis not present

## 2019-08-24 DIAGNOSIS — M5136 Other intervertebral disc degeneration, lumbar region: Secondary | ICD-10-CM | POA: Diagnosis not present

## 2019-08-24 DIAGNOSIS — M9903 Segmental and somatic dysfunction of lumbar region: Secondary | ICD-10-CM | POA: Diagnosis not present

## 2019-08-26 DIAGNOSIS — Z0000 Encounter for general adult medical examination without abnormal findings: Secondary | ICD-10-CM | POA: Diagnosis not present

## 2019-08-26 DIAGNOSIS — G4700 Insomnia, unspecified: Secondary | ICD-10-CM | POA: Diagnosis not present

## 2019-08-26 DIAGNOSIS — Z1211 Encounter for screening for malignant neoplasm of colon: Secondary | ICD-10-CM | POA: Diagnosis not present

## 2019-08-26 DIAGNOSIS — Z23 Encounter for immunization: Secondary | ICD-10-CM | POA: Diagnosis not present

## 2019-09-12 DIAGNOSIS — N400 Enlarged prostate without lower urinary tract symptoms: Secondary | ICD-10-CM | POA: Diagnosis not present

## 2019-09-12 DIAGNOSIS — R972 Elevated prostate specific antigen [PSA]: Secondary | ICD-10-CM | POA: Diagnosis not present

## 2019-09-14 DIAGNOSIS — M9903 Segmental and somatic dysfunction of lumbar region: Secondary | ICD-10-CM | POA: Diagnosis not present

## 2019-09-14 DIAGNOSIS — M9902 Segmental and somatic dysfunction of thoracic region: Secondary | ICD-10-CM | POA: Diagnosis not present

## 2019-09-14 DIAGNOSIS — M9904 Segmental and somatic dysfunction of sacral region: Secondary | ICD-10-CM | POA: Diagnosis not present

## 2019-09-14 DIAGNOSIS — M5135 Other intervertebral disc degeneration, thoracolumbar region: Secondary | ICD-10-CM | POA: Diagnosis not present

## 2019-09-14 DIAGNOSIS — M5136 Other intervertebral disc degeneration, lumbar region: Secondary | ICD-10-CM | POA: Diagnosis not present

## 2019-09-14 DIAGNOSIS — M5137 Other intervertebral disc degeneration, lumbosacral region: Secondary | ICD-10-CM | POA: Diagnosis not present

## 2019-09-21 DIAGNOSIS — M9902 Segmental and somatic dysfunction of thoracic region: Secondary | ICD-10-CM | POA: Diagnosis not present

## 2019-09-21 DIAGNOSIS — M9904 Segmental and somatic dysfunction of sacral region: Secondary | ICD-10-CM | POA: Diagnosis not present

## 2019-09-21 DIAGNOSIS — M5137 Other intervertebral disc degeneration, lumbosacral region: Secondary | ICD-10-CM | POA: Diagnosis not present

## 2019-09-21 DIAGNOSIS — M5135 Other intervertebral disc degeneration, thoracolumbar region: Secondary | ICD-10-CM | POA: Diagnosis not present

## 2019-09-21 DIAGNOSIS — M9903 Segmental and somatic dysfunction of lumbar region: Secondary | ICD-10-CM | POA: Diagnosis not present

## 2019-09-21 DIAGNOSIS — M5136 Other intervertebral disc degeneration, lumbar region: Secondary | ICD-10-CM | POA: Diagnosis not present

## 2019-09-28 DIAGNOSIS — M5135 Other intervertebral disc degeneration, thoracolumbar region: Secondary | ICD-10-CM | POA: Diagnosis not present

## 2019-09-28 DIAGNOSIS — M9904 Segmental and somatic dysfunction of sacral region: Secondary | ICD-10-CM | POA: Diagnosis not present

## 2019-09-28 DIAGNOSIS — M9902 Segmental and somatic dysfunction of thoracic region: Secondary | ICD-10-CM | POA: Diagnosis not present

## 2019-09-28 DIAGNOSIS — M9903 Segmental and somatic dysfunction of lumbar region: Secondary | ICD-10-CM | POA: Diagnosis not present

## 2019-09-28 DIAGNOSIS — M5136 Other intervertebral disc degeneration, lumbar region: Secondary | ICD-10-CM | POA: Diagnosis not present

## 2019-09-28 DIAGNOSIS — M5137 Other intervertebral disc degeneration, lumbosacral region: Secondary | ICD-10-CM | POA: Diagnosis not present

## 2019-10-12 DIAGNOSIS — M5135 Other intervertebral disc degeneration, thoracolumbar region: Secondary | ICD-10-CM | POA: Diagnosis not present

## 2019-10-12 DIAGNOSIS — M5137 Other intervertebral disc degeneration, lumbosacral region: Secondary | ICD-10-CM | POA: Diagnosis not present

## 2019-10-12 DIAGNOSIS — M9904 Segmental and somatic dysfunction of sacral region: Secondary | ICD-10-CM | POA: Diagnosis not present

## 2019-10-12 DIAGNOSIS — M9903 Segmental and somatic dysfunction of lumbar region: Secondary | ICD-10-CM | POA: Diagnosis not present

## 2019-10-12 DIAGNOSIS — M9902 Segmental and somatic dysfunction of thoracic region: Secondary | ICD-10-CM | POA: Diagnosis not present

## 2019-10-12 DIAGNOSIS — M5136 Other intervertebral disc degeneration, lumbar region: Secondary | ICD-10-CM | POA: Diagnosis not present

## 2019-12-03 DIAGNOSIS — M19011 Primary osteoarthritis, right shoulder: Secondary | ICD-10-CM | POA: Diagnosis not present

## 2019-12-03 DIAGNOSIS — M19012 Primary osteoarthritis, left shoulder: Secondary | ICD-10-CM | POA: Diagnosis not present

## 2019-12-07 ENCOUNTER — Ambulatory Visit: Payer: Medicare Other

## 2019-12-13 ENCOUNTER — Ambulatory Visit: Payer: Medicare Other

## 2019-12-14 ENCOUNTER — Ambulatory Visit: Payer: Medicare Other | Attending: Internal Medicine

## 2019-12-14 DIAGNOSIS — Z23 Encounter for immunization: Secondary | ICD-10-CM | POA: Insufficient documentation

## 2019-12-14 NOTE — Progress Notes (Signed)
   Covid-19 Vaccination Clinic  Name:  Leonard Burke    MRN: [id number] DOB: 1951-05-27  12/14/2019  [name] was observed post Covid-19 immunization for 15 minutes without incidence. He was provided with Vaccine Information Sheet and instruction to access the V-Safe system.   [name] was instructed to call 911 with any severe reactions post vaccine: Marland Kitchen Difficulty breathing  . Swelling of your face and throat  . A fast heartbeat  . A bad rash all over your body  . Dizziness and weakness    Immunizations Administered    Name Date Dose VIS Date Route   Pfizer COVID-19 Vaccine 12/14/2019  8:20 AM 0.3 mL 10/18/2019 Intramuscular   Manufacturer: [place]   Lot: [id number]   [place]: [id number]

## 2019-12-24 DIAGNOSIS — C61 Malignant neoplasm of prostate: Secondary | ICD-10-CM | POA: Diagnosis not present

## 2019-12-24 DIAGNOSIS — R972 Elevated prostate specific antigen [PSA]: Secondary | ICD-10-CM | POA: Diagnosis not present

## 2020-01-01 DIAGNOSIS — G4700 Insomnia, unspecified: Secondary | ICD-10-CM | POA: Diagnosis not present

## 2020-01-01 DIAGNOSIS — J45909 Unspecified asthma, uncomplicated: Secondary | ICD-10-CM | POA: Diagnosis not present

## 2020-01-01 DIAGNOSIS — I10 Essential (primary) hypertension: Secondary | ICD-10-CM | POA: Diagnosis not present

## 2020-01-01 DIAGNOSIS — E782 Mixed hyperlipidemia: Secondary | ICD-10-CM | POA: Diagnosis not present

## 2020-01-08 ENCOUNTER — Ambulatory Visit: Payer: Medicare Other | Attending: Internal Medicine

## 2020-01-08 DIAGNOSIS — Z23 Encounter for immunization: Secondary | ICD-10-CM | POA: Insufficient documentation

## 2020-01-08 NOTE — Progress Notes (Signed)
   Covid-19 Vaccination Clinic  Name:  Leonard Burke    MRN: [id number] DOB: 09-23-1951  01/08/2020  Leonard Burke was observed post Covid-19 immunization for 15 minutes without incident. He was provided with Vaccine Information Sheet and instruction to access the V-Safe system.   Leonard Burke was instructed to call 911 with any severe reactions post vaccine: Marland Kitchen Difficulty breathing  . Swelling of face and throat  . A fast heartbeat  . A bad rash all over body  . Dizziness and weakness   Immunizations Administered    Name Date Dose VIS Date Route   Pfizer COVID-19 Vaccine 01/08/2020  8:14 AM 0.3 mL 10/18/2019 Intramuscular   Manufacturer: [place]   Lot: [id number]   [place]: [id number]

## 2020-01-23 ENCOUNTER — Other Ambulatory Visit: Payer: Self-pay | Admitting: Urology

## 2020-01-23 DIAGNOSIS — C61 Malignant neoplasm of prostate: Secondary | ICD-10-CM | POA: Diagnosis not present

## 2020-07-01 DIAGNOSIS — I10 Essential (primary) hypertension: Secondary | ICD-10-CM | POA: Diagnosis not present

## 2020-07-01 DIAGNOSIS — J45909 Unspecified asthma, uncomplicated: Secondary | ICD-10-CM | POA: Diagnosis not present

## 2020-07-01 DIAGNOSIS — E782 Mixed hyperlipidemia: Secondary | ICD-10-CM | POA: Diagnosis not present

## 2020-07-01 DIAGNOSIS — G4700 Insomnia, unspecified: Secondary | ICD-10-CM | POA: Diagnosis not present

## 2020-07-21 DIAGNOSIS — M19011 Primary osteoarthritis, right shoulder: Secondary | ICD-10-CM | POA: Diagnosis not present

## 2020-07-21 DIAGNOSIS — M1712 Unilateral primary osteoarthritis, left knee: Secondary | ICD-10-CM | POA: Diagnosis not present

## 2020-07-21 DIAGNOSIS — M19012 Primary osteoarthritis, left shoulder: Secondary | ICD-10-CM | POA: Diagnosis not present

## 2020-07-23 ENCOUNTER — Inpatient Hospital Stay: Admission: RE | Admit: 2020-07-23 | Payer: Medicare Other | Source: Ambulatory Visit

## 2020-07-31 DIAGNOSIS — C61 Malignant neoplasm of prostate: Secondary | ICD-10-CM | POA: Diagnosis not present

## 2020-07-31 DIAGNOSIS — Z23 Encounter for immunization: Secondary | ICD-10-CM | POA: Diagnosis not present

## 2020-08-06 DIAGNOSIS — E782 Mixed hyperlipidemia: Secondary | ICD-10-CM | POA: Diagnosis not present

## 2020-08-06 DIAGNOSIS — I10 Essential (primary) hypertension: Secondary | ICD-10-CM | POA: Diagnosis not present

## 2020-08-06 DIAGNOSIS — G4700 Insomnia, unspecified: Secondary | ICD-10-CM | POA: Diagnosis not present

## 2020-08-06 DIAGNOSIS — J45909 Unspecified asthma, uncomplicated: Secondary | ICD-10-CM | POA: Diagnosis not present

## 2020-09-02 ENCOUNTER — Other Ambulatory Visit: Payer: Self-pay | Admitting: Urology

## 2020-09-02 DIAGNOSIS — C61 Malignant neoplasm of prostate: Secondary | ICD-10-CM

## 2020-09-30 DIAGNOSIS — E782 Mixed hyperlipidemia: Secondary | ICD-10-CM | POA: Diagnosis not present

## 2020-09-30 DIAGNOSIS — J45909 Unspecified asthma, uncomplicated: Secondary | ICD-10-CM | POA: Diagnosis not present

## 2020-09-30 DIAGNOSIS — G4700 Insomnia, unspecified: Secondary | ICD-10-CM | POA: Diagnosis not present

## 2020-09-30 DIAGNOSIS — I10 Essential (primary) hypertension: Secondary | ICD-10-CM | POA: Diagnosis not present

## 2020-11-04 DIAGNOSIS — J45909 Unspecified asthma, uncomplicated: Secondary | ICD-10-CM | POA: Diagnosis not present

## 2020-11-04 DIAGNOSIS — E782 Mixed hyperlipidemia: Secondary | ICD-10-CM | POA: Diagnosis not present

## 2020-11-04 DIAGNOSIS — G4700 Insomnia, unspecified: Secondary | ICD-10-CM | POA: Diagnosis not present

## 2020-11-04 DIAGNOSIS — I10 Essential (primary) hypertension: Secondary | ICD-10-CM | POA: Diagnosis not present
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
## 2020-12-07 DIAGNOSIS — J45909 Unspecified asthma, uncomplicated: Secondary | ICD-10-CM | POA: Diagnosis not present

## 2020-12-07 DIAGNOSIS — I10 Essential (primary) hypertension: Secondary | ICD-10-CM | POA: Diagnosis not present

## 2020-12-07 DIAGNOSIS — M1990 Unspecified osteoarthritis, unspecified site: Secondary | ICD-10-CM | POA: Diagnosis not present

## 2020-12-07 DIAGNOSIS — E782 Mixed hyperlipidemia: Secondary | ICD-10-CM | POA: Diagnosis not present

## 2021-01-06 DIAGNOSIS — I10 Essential (primary) hypertension: Secondary | ICD-10-CM | POA: Diagnosis not present

## 2021-01-06 DIAGNOSIS — G4700 Insomnia, unspecified: Secondary | ICD-10-CM | POA: Diagnosis not present

## 2021-01-06 DIAGNOSIS — E782 Mixed hyperlipidemia: Secondary | ICD-10-CM | POA: Diagnosis not present

## 2021-01-06 DIAGNOSIS — J45909 Unspecified asthma, uncomplicated: Secondary | ICD-10-CM | POA: Diagnosis not present

## 2021-02-05 ENCOUNTER — Other Ambulatory Visit: Payer: Medicare Other

## 2021-02-06 ENCOUNTER — Other Ambulatory Visit: Payer: Self-pay | Admitting: Urology

## 2021-02-06 ENCOUNTER — Ambulatory Visit
Admission: RE | Admit: 2021-02-06 | Discharge: 2021-02-06 | Disposition: A | Discharge status: Home / Self Care | Payer: Medicare Other | Source: Ambulatory Visit | Attending: Urology | Admitting: Urology

## 2021-02-06 ENCOUNTER — Other Ambulatory Visit: Payer: Self-pay

## 2021-02-06 DIAGNOSIS — Z77018 Contact with and (suspected) exposure to other hazardous metals: Secondary | ICD-10-CM

## 2021-02-06 DIAGNOSIS — C61 Malignant neoplasm of prostate: Secondary | ICD-10-CM

## 2021-02-06 MED ORDER — GADOBENATE DIMEGLUMINE 529 MG/ML IV SOLN
20.0000 mL | Freq: Once | INTRAVENOUS | Status: AC | PRN
  Administered 2021-02-06: 20 mL via INTRAVENOUS

## 2021-02-11 DIAGNOSIS — M19011 Primary osteoarthritis, right shoulder: Secondary | ICD-10-CM | POA: Diagnosis not present

## 2021-02-11 DIAGNOSIS — M19012 Primary osteoarthritis, left shoulder: Secondary | ICD-10-CM | POA: Diagnosis not present

## 2021-03-06 DIAGNOSIS — G4700 Insomnia, unspecified: Secondary | ICD-10-CM | POA: Diagnosis not present

## 2021-03-06 DIAGNOSIS — I10 Essential (primary) hypertension: Secondary | ICD-10-CM | POA: Diagnosis not present

## 2021-03-06 DIAGNOSIS — E782 Mixed hyperlipidemia: Secondary | ICD-10-CM | POA: Diagnosis not present

## 2021-03-06 DIAGNOSIS — J45909 Unspecified asthma, uncomplicated: Secondary | ICD-10-CM | POA: Diagnosis not present

## 2021-04-08 DIAGNOSIS — C61 Malignant neoplasm of prostate: Secondary | ICD-10-CM | POA: Diagnosis not present

## 2021-05-25 DIAGNOSIS — H6123 Impacted cerumen, bilateral: Secondary | ICD-10-CM | POA: Diagnosis not present

## 2021-06-03 DIAGNOSIS — I10 Essential (primary) hypertension: Secondary | ICD-10-CM | POA: Diagnosis not present

## 2021-06-03 DIAGNOSIS — G4700 Insomnia, unspecified: Secondary | ICD-10-CM | POA: Diagnosis not present

## 2021-06-03 DIAGNOSIS — J45909 Unspecified asthma, uncomplicated: Secondary | ICD-10-CM | POA: Diagnosis not present

## 2021-06-03 DIAGNOSIS — E782 Mixed hyperlipidemia: Secondary | ICD-10-CM | POA: Diagnosis not present

## 2021-06-04 DIAGNOSIS — E782 Mixed hyperlipidemia: Secondary | ICD-10-CM | POA: Diagnosis not present

## 2021-06-04 DIAGNOSIS — J45909 Unspecified asthma, uncomplicated: Secondary | ICD-10-CM | POA: Diagnosis not present

## 2021-06-04 DIAGNOSIS — I10 Essential (primary) hypertension: Secondary | ICD-10-CM | POA: Diagnosis not present

## 2021-06-04 DIAGNOSIS — C61 Malignant neoplasm of prostate: Secondary | ICD-10-CM | POA: Diagnosis not present

## 2021-06-08 DIAGNOSIS — C61 Malignant neoplasm of prostate: Secondary | ICD-10-CM | POA: Diagnosis not present

## 2021-06-08 DIAGNOSIS — N400 Enlarged prostate without lower urinary tract symptoms: Secondary | ICD-10-CM | POA: Diagnosis not present

## 2021-06-22 DIAGNOSIS — M1990 Unspecified osteoarthritis, unspecified site: Secondary | ICD-10-CM | POA: Diagnosis not present

## 2021-06-22 DIAGNOSIS — L719 Rosacea, unspecified: Secondary | ICD-10-CM | POA: Diagnosis not present

## 2021-06-23 ENCOUNTER — Other Ambulatory Visit: Payer: Self-pay

## 2021-06-23 ENCOUNTER — Ambulatory Visit
Admission: RE | Admit: 2021-06-23 | Discharge: 2021-06-23 | Disposition: A | Discharge status: Home / Self Care | Payer: Medicare Other | Source: Ambulatory Visit | Attending: Radiation Oncology | Admitting: Radiation Oncology

## 2021-06-23 ENCOUNTER — Encounter: Payer: Self-pay | Admitting: Radiation Oncology

## 2021-06-23 VITALS — BP 122/69 | HR 66 | Temp 97.0°F | Resp 20 | Ht 70.0 in | Wt 210.1 lb

## 2021-06-23 DIAGNOSIS — Z87891 Personal history of nicotine dependence: Secondary | ICD-10-CM | POA: Insufficient documentation

## 2021-06-23 DIAGNOSIS — E781 Pure hyperglyceridemia: Secondary | ICD-10-CM | POA: Insufficient documentation

## 2021-06-23 DIAGNOSIS — I10 Essential (primary) hypertension: Secondary | ICD-10-CM | POA: Diagnosis not present

## 2021-06-23 DIAGNOSIS — Z79899 Other long term (current) drug therapy: Secondary | ICD-10-CM | POA: Insufficient documentation

## 2021-06-23 DIAGNOSIS — C61 Malignant neoplasm of prostate: Secondary | ICD-10-CM | POA: Diagnosis not present

## 2021-06-23 NOTE — Progress Notes (Signed)
Radiation Oncology         [phone] ________________________________  Initial Outpatient Consultation  Name: Leonard Burke MRN: [id number]  Date: 06/23/2021  DOB: 24-May-1951  [id number], Annie Main, MD  Lucas Mallow, MD   REFERRING PHYSICIAN: Lucas Mallow, MD  DIAGNOSIS: 70 y.o. gentleman with Stage T1c adenocarcinoma of the prostate with Gleason score of 3+4, and PSA of 4.92.    ICD-10-CM   1. Malignant neoplasm of prostate ([place])  C61       HISTORY OF PRESENT ILLNESS: Leonard Burke is a 70 y.o. male with a diagnosis of prostate cancer. He has a history of elevated PSA since at least 2019 and was initially diagnosed with Gleason 3+3 adenocarcinoma of the prostate on biopsy with Dr. Gloriann Loan on 12/24/2019.  PSA at the time of diagnosis was 5.42 and Gleason 3+3 disease was noted in 2 out of the 12 core biopsies, bilaterally in the apices.  He elected to proceed with active surveillance and digital rectal examination has remained without concerning findings or discrete nodularity.  A repeat PSA was 4.08 in September 2021.  MRI of the prostate was performed on 02/06/2021 for surveillance and showed a 10 mm PI-RADS 4 lesion in the right lateral apex without evidence of extracapsular extension or metastatic disease.  The prostate volume was estimated at 70 g.  The patient proceeded to MRI fusion transrectal ultrasound with a total of 16 biopsies of the prostate on 04/08/2021.  The prostate volume on ultrasound measured 54 cc.  Out of 12 systematic core biopsies, 3 were positive and all 4 of 4 samples from the MRI region of interest were positive for a total of 7 out of 16 biopsies positive.  The maximum Gleason score was 3+4, and this was seen in one of the 4 samples from the MRI ROI.  Additionally, Gleason 3+3 disease was seen in the left mid, left mid lateral, right apex lateral and 3 of 4 samples from the MRI ROI.  His most recent PSA, performed at the time of biopsy on 04/08/2021 was  4.92.  The patient reviewed the biopsy results with his urologist and he has kindly been referred today for discussion of potential radiation treatment options.   PREVIOUS RADIATION THERAPY: No  PAST MEDICAL HISTORY:  Past Medical History:  Diagnosis Date   HTN (hypertension)    Hypertriglyceridemia       PAST SURGICAL HISTORY:No past surgical history on file.  FAMILY HISTORY:  Family History  Problem Relation Age of Onset   Other Neg Hx        hypertriglyceridemia    SOCIAL HISTORY:  Social History   Socioeconomic History   Marital status: Married    Spouse name: Not on file   Number of children: Not on file   Years of education: Not on file   Highest education level: Not on file  Occupational History   Not on file  Tobacco Use   Smoking status: Former    Types: Cigarettes   Smokeless tobacco: Never  Vaping Use   Vaping Use: Never used  Substance and Sexual Activity   Alcohol use: Yes    Comment: Occasional   Drug use: No   Sexual activity: Not on file  Other Topics Concern   Not on file  Social History Narrative   Not on file   Social Determinants of Health   Financial Resource Strain: Not on file  Food Insecurity: Not on file  Transportation Needs:  Not on file  Physical Activity: Not on file  Stress: Not on file  Social Connections: Not on file  Intimate Partner Violence: Not on file    ALLERGIES: Codeine and Penicillins  MEDICATIONS:  Current Outpatient Medications  Medication Sig Dispense Refill   amLODipine (NORVASC) 10 MG tablet Take 10 mg by mouth daily.     atenolol (TENORMIN) 25 MG tablet TAKE 1 TABLET BY MOUTH EVERY DAY 30 tablet 3   diclofenac (VOLTAREN) 75 MG EC tablet Take 75 mg by mouth 2 (two) times daily.     fenofibrate (TRICOR) 145 MG tablet Take 1 tablet (145 mg total) by mouth daily. 90 tablet 3   hydrochlorothiazide (MICROZIDE) 12.5 MG capsule Take 12.5 mg by mouth daily.  0   Icosapent Ethyl 1 g CAPS Take 2 g by mouth  daily. 60 capsule 11   meloxicam (MOBIC) 15 MG tablet Take 15 mg by mouth daily.     olmesartan (BENICAR) 20 MG tablet Take 1 tablet (20 mg total) by mouth daily. 30 tablet 11   rosuvastatin (CRESTOR) 5 MG tablet Take 5 mg by mouth daily.     VENTOLIN HFA 108 (90 Base) MCG/ACT inhaler Inhale 90 mcg into the lungs as needed.  1   zolpidem (AMBIEN) 10 MG tablet Take 10 mg by mouth daily as needed.     No current facility-administered medications for this encounter.    REVIEW OF SYSTEMS:  On review of systems, the patient reports that he is doing well overall. He denies any chest pain, shortness of breath, cough, fevers, chills, night sweats, unintended weight changes. He denies any bowel disturbances, and denies abdominal pain, nausea or vomiting. He denies any new musculoskeletal or joint aches or pains. His IPSS was 10, indicating mild-moderate urinary symptoms. His SHIM was 15, indicating he has moderate erectile dysfunction. A complete review of systems is obtained and is otherwise negative.    PHYSICAL EXAM:  Wt Readings from Last 3 Encounters:  06/23/21 210 lb 2 oz (95.3 kg)  11/23/18 235 lb (106.6 kg)  12/18/17 235 lb 9.6 oz (106.9 kg)   Temp Readings from Last 3 Encounters:  06/23/21 (!) 97 F (36.1 C) (Temporal)  11/23/18 98.1 F (36.7 C) (Oral)   BP Readings from Last 3 Encounters:  06/23/21 122/69  11/23/18 139/73  12/18/17 122/71   Pulse Readings from Last 3 Encounters:  06/23/21 66  11/23/18 66  12/18/17 62    /10  In general this is a well appearing Caucasian male in no acute distress. He's alert and oriented x4 and appropriate throughout the examination. Cardiopulmonary assessment is negative for acute distress, and he exhibits normal effort.     KPS = 100  100 - Normal; no complaints; no evidence of disease. 90   - Able to carry on normal activity; minor signs or symptoms of disease. 80   - Normal activity with effort; some signs or symptoms of disease. 58    - Cares for self; unable to carry on normal activity or to do active work. 60   - Requires occasional assistance, but is able to care for most of his personal needs. 50   - Requires considerable assistance and frequent medical care. 41   - Disabled; requires special care and assistance. 70   - Severely disabled; hospital admission is indicated although death not imminent. 23   - Very sick; hospital admission necessary; active supportive treatment necessary. 10   - Moribund; fatal processes progressing rapidly.  0     - Dead  Karnofsky DA, Abelmann WH, Craver LS and Burchenal JH [phone]) The use of the nitrogen mustards in the palliative treatment of carcinoma: with particular reference to bronchogenic carcinoma Cancer 1 634-56  LABORATORY DATA:  No results found for: WBC, HGB, HCT, MCV, PLT No results found for: NA, K, CL, CO2 No results found for: ALT, AST, GGT, ALKPHOS, BILITOT   RADIOGRAPHY: No results found.    IMPRESSION/PLAN: 1. 70 y.o. gentleman with Stage T1c adenocarcinoma of the prostate with Gleason Score of 3+4, and PSA of 4.92. We discussed the patient's workup and outlined the nature of prostate cancer in this setting. The patient's T stage, Gleason's score, and PSA put him into the favorable intermediate risk group. Accordingly, he is eligible for a variety of potential treatment options including brachytherapy, 5.5 weeks of external radiation, or prostatectomy. We discussed the available radiation techniques, and focused on the details and logistics of delivery.  We discussed and outlined the risks, benefits, short and long-term effects associated with radiotherapy and compared and contrasted these with prostatectomy. We discussed the role of SpaceOAR gel in reducing the rectal toxicity associated with radiotherapy.  We also discussed continuing in active surveillance which is not a guideline recommendation but given his small volume with only a single focus of Gleason 3+4 in 1 of  the 6 positive cores, this appears to be a reasonable option for him.  He appears to have a good understanding of his disease and our treatment recommendations which are of curative intent.  He was encouraged to ask questions that were answered to his stated satisfaction.  At the conclusion of our conversation, the patient is interested in moving forward with continued active surveillance.  He understands that with Gleason 3+4 disease, there is some increased risk associated with active surveillance but he has a highly compliant patient and intends to continue with close monitoring of the PSA and is not opposed to having further prostate MRI scans and periodic prostate biopsies to detect any early progression of disease.  He reports that in the future, should there be indication of disease progression, he is leaning towards proceeding with external beam radiotherapy.  We will share our discussion with Dr. Gloriann Loan and he plans to keep his scheduled follow-up visit on 07/16/2021 to make the appropriate plans for continued active surveillance. We enjoyed meeting him today and look forward to continuing to follow his progress via correspondence.  We personally spent 70 minutes in this encounter including chart review, reviewing radiological studies, meeting face-to-face with the patient, entering orders and completing documentation.    Nicholos Johns, PA-C    Tyler Pita, MD  [facility] Direct Dial: [phone]  Fax: [phone] [email or web address]  Skype  [email or web address]

## 2021-06-23 NOTE — Progress Notes (Signed)
GU Location of Tumor / Histology:  Adenocarcinoma of the prostate  If Prostate Cancer, Gleason Score is (3 + 4), PSA (4.92 as of 04/08/21), and Prostate volume (54g via ultrasound; 70g via MRI)  Leonard Burke presented with signs/symptoms of: elevated PSA levels and moderate lower urinary tract symptoms  Biopsies revealed:  04/08/2021  12/24/2019   Past/Anticipated interventions by urology, if any:  06/08/2021 Dr. Link Snuffer (office visit)   04/08/2021 Dr. Link Snuffer --MRI fusion transrectal ultrasound of the prostate with biopsies  12/23/2020 Dr. Link Snuffer --Transrectal ultrasound of the prostate with biopsies  Past/Anticipated interventions by medical oncology, if any:  No referral placed at this time  Weight changes, if any: Reports ~28 lb loss since January do to stress of moving/retiring, both of which decreased his appetite  IPSS Score: 10 (moderate) SHIM Score: 15 (mild-moderate ED; reports it's not a current concern for him since he's not currently sexually active)  Bowel/Bladder complaints, if any: Patient denies any changes in bowel habits, or new/worsening urinary habits. Does report he would be mostly dissatisfied if he had to live with his current urinary condition for the rest of his life  Nausea/Vomiting, if any: Patient denies  Pain issues, if any:  Arthritic pain to both knees, both shoulders, and lower back.  SAFETY ISSUES: Prior radiation? No Pacemaker/ICD? No Possible current pregnancy? N/A Is the patient on methotrexate? No  Current Complaints / other details:  Patient has received the first 3 Pfizer vaccines

## 2021-06-24 ENCOUNTER — Encounter: Payer: Self-pay | Admitting: Licensed Clinical Social Worker

## 2021-06-24 NOTE — Progress Notes (Signed)
[place] Psychosocial Distress Screening Clinical Social Work  Clinical Social Work was referred by distress screening protocol.  The patient scored a 5 on the Psychosocial Distress Thermometer which indicates moderate distress. Clinical Social Worker contacted patient by phone to assess for distress and other psychosocial needs.   Patient is going to do active surveillance for the time being and feels positive about this decision after his appointment yesterday. CSW gave brief overview of support services should patient opt for treatment in the future and discussed groups still available regardless of treatment status. Patient was interested in having support program calendar mailed to him. CSW sent today.  ONCBCN DISTRESS SCREENING 06/23/2021  Screening Type Initial Screening  Distress experienced in past week (1-10) 5  Practical problem type Housing;Insurance  Family Problem type Children  Emotional problem type Adjusting to illness  Information Concerns Type Lack of info about diagnosis;Lack of info about treatment;Lack of info about maintaining fitness  Physical Problem type Pain;Getting around;Bathing/dressing;Breathing  Physician notified of physical symptoms Yes  Referral to clinical psychology No  Referral to clinical social work Yes  Referral to dietition No  Referral to financial advocate Yes  Referral to support programs Yes  Referral to palliative care No    Clinical Social Worker follow up needed: No.  If yes, follow up plan:  Leonard Burke Leonard Skila Rollins, LCSW

## 2021-07-06 DIAGNOSIS — E782 Mixed hyperlipidemia: Secondary | ICD-10-CM | POA: Diagnosis not present

## 2021-07-06 DIAGNOSIS — J45909 Unspecified asthma, uncomplicated: Secondary | ICD-10-CM | POA: Diagnosis not present

## 2021-07-06 DIAGNOSIS — G4700 Insomnia, unspecified: Secondary | ICD-10-CM | POA: Diagnosis not present

## 2021-07-06 DIAGNOSIS — I10 Essential (primary) hypertension: Secondary | ICD-10-CM | POA: Diagnosis not present

## 2021-07-07 DIAGNOSIS — D3131 Benign neoplasm of right choroid: Secondary | ICD-10-CM | POA: Diagnosis not present

## 2021-07-16 DIAGNOSIS — C61 Malignant neoplasm of prostate: Secondary | ICD-10-CM | POA: Diagnosis not present

## 2021-07-30 DIAGNOSIS — H353112 Nonexudative age-related macular degeneration, right eye, intermediate dry stage: Secondary | ICD-10-CM | POA: Diagnosis not present

## 2021-07-30 DIAGNOSIS — H348312 Tributary (branch) retinal vein occlusion, right eye, stable: Secondary | ICD-10-CM | POA: Diagnosis not present

## 2021-07-30 DIAGNOSIS — H35033 Hypertensive retinopathy, bilateral: Secondary | ICD-10-CM | POA: Diagnosis not present

## 2021-07-30 DIAGNOSIS — H3562 Retinal hemorrhage, left eye: Secondary | ICD-10-CM | POA: Diagnosis not present

## 2021-09-15 DIAGNOSIS — M25511 Pain in right shoulder: Secondary | ICD-10-CM | POA: Diagnosis not present

## 2021-09-15 DIAGNOSIS — M25512 Pain in left shoulder: Secondary | ICD-10-CM | POA: Diagnosis not present

## 2021-09-29 DIAGNOSIS — M170 Bilateral primary osteoarthritis of knee: Secondary | ICD-10-CM | POA: Diagnosis not present

## 2021-09-29 DIAGNOSIS — M25551 Pain in right hip: Secondary | ICD-10-CM | POA: Diagnosis not present

## 2021-09-29 DIAGNOSIS — M7061 Trochanteric bursitis, right hip: Secondary | ICD-10-CM | POA: Diagnosis not present

## 2021-10-12 DIAGNOSIS — J45909 Unspecified asthma, uncomplicated: Secondary | ICD-10-CM | POA: Diagnosis not present

## 2021-10-12 DIAGNOSIS — C61 Malignant neoplasm of prostate: Secondary | ICD-10-CM | POA: Diagnosis not present

## 2021-10-12 DIAGNOSIS — E782 Mixed hyperlipidemia: Secondary | ICD-10-CM | POA: Diagnosis not present

## 2021-10-12 DIAGNOSIS — I10 Essential (primary) hypertension: Secondary | ICD-10-CM | POA: Diagnosis not present

## 2021-11-19 DIAGNOSIS — H348312 Tributary (branch) retinal vein occlusion, right eye, stable: Secondary | ICD-10-CM | POA: Diagnosis not present

## 2021-11-19 DIAGNOSIS — H3562 Retinal hemorrhage, left eye: Secondary | ICD-10-CM | POA: Diagnosis not present

## 2021-11-19 DIAGNOSIS — H353112 Nonexudative age-related macular degeneration, right eye, intermediate dry stage: Secondary | ICD-10-CM | POA: Diagnosis not present

## 2021-11-19 DIAGNOSIS — H35033 Hypertensive retinopathy, bilateral: Secondary | ICD-10-CM | POA: Diagnosis not present

## 2021-11-26 NOTE — Progress Notes (Addendum)
COVID swab appointment: 12/07/21 @ 0945  COVID Vaccine Completed: yes x2 Date COVID Vaccine completed: 12/14/19, 01/08/20 Has received booster: COVID vaccine manufacturer: Pfizer      Date of COVID positive in last 90 days: no  PCP - Orpah Melter, MD Cardiologist - n/a  Chest x-ray - n/a EKG - 11/29/21 Epic/chart Stress Test - many years ago pet pt ECHO - n/a Cardiac Cath - n/a Pacemaker/ICD device last checked: n/a Spinal Cord Stimulator: n/a  Sleep Study - n/a CPAP -   Fasting Blood Sugar - n/a Checks Blood Sugar _____ times a day  Blood Thinner Instructions: n/a Aspirin Instructions: Last Dose:  Activity level: Can go up a flight of stairs and perform activities of daily living without stopping and without symptoms of chest pain or shortness of breath.     Anesthesia review: left anterior fascicular block on EKG, HTN  Patient denies shortness of breath, fever, cough and chest pain at PAT appointment   Patient verbalized understanding of instructions that were given to them at the PAT appointment. Patient was also instructed that they will need to review over the PAT instructions again at home before surgery.

## 2021-11-26 NOTE — Patient Instructions (Addendum)
DUE TO COVID-19 ONLY ONE VISITOR IS ALLOWED TO COME WITH YOU AND STAY IN THE WAITING ROOM ONLY DURING PRE OP AND PROCEDURE.   **NO VISITORS ARE ALLOWED IN THE SHORT STAY AREA OR RECOVERY ROOM!!**  IF YOU WILL BE ADMITTED INTO THE HOSPITAL YOU ARE ALLOWED ONLY TWO SUPPORT PEOPLE DURING VISITATION HOURS ONLY (7 AM -8PM)   The support person(s) must pass our screening, gel in and out, and wear a mask at all times, including in the patients room. Patients must also wear a mask when staff or their support person are in the room. Visitors GUEST BADGE MUST BE WORN VISIBLY  One adult visitor may remain with you overnight and MUST be in the room by 8 P.M.  No visitors under the age of 72. Any visitor under the age of 84 must be accompanied by an adult.    COVID SWAB TESTING MUST BE COMPLETED ON:  12/07/21 @ 9:45 AM   Site: [facility].  [facility] [place] Enter: Main Entrance have a seat in the waiting area to the right of main entrance (DO NOT [place]!!!!!) Dial: [phone] to alert staff you have arrived  You are not required to quarantine, however you are required to wear a well-fitted mask when you are out and around people not in your household.  Hand Hygiene often Do NOT share personal items Notify your provider if you are in close contact with someone who has COVID or you develop fever 100.4 or greater, new onset of sneezing, cough, sore throat, shortness of breath or body aches.       Your procedure is scheduled on: 12/09/21   Report to [facility] Main Entrance    Report to admitting at 6:00 AM   Call this number if you have problems the morning of surgery [phone]   Do not eat food :After Midnight.   May have liquids until 5:40 AM day of surgery  CLEAR LIQUID DIET  Foods Allowed                                                                     Foods Excluded  Water, Black Coffee and tea (no milk or creamer)           liquids  that you cannot  Plain Jell-O in any flavor  (No red)                                    see through such as: Fruit ices (not with fruit pulp)                                            milk, soups, orange juice              Iced Popsicles (No red)  All solid food                                   Apple juices Sports drinks like Gatorade (No red) Lightly seasoned clear broth or consume(fat free) Sugar    The day of surgery:  Drink ONE (1) Pre-Surgery Clear Ensure at 5:40 AM the morning of surgery. Drink in one sitting. Do not sip.  This drink was given to you during your hospital  pre-op appointment visit. Nothing else to drink after completing the  Pre-Surgery Clear Ensure.          If you have questions, please contact your surgeons office.     Oral Hygiene is also important to reduce your risk of infection.                                    Remember - BRUSH YOUR TEETH THE MORNING OF SURGERY WITH YOUR REGULAR TOOTHPASTE   Take these medicines the morning of surgery with A SIP OF WATER: Amlodipine, Atenolol, Fenofibrate, Crestor                              You may not have any metal on your body including jewelry, and body piercing             Do not wear lotions, powders, cologne, or deodorant              Men may shave face and neck.   Do not bring valuables to the hospital. [place].   Bring small overnight bag day of surgery.   Special Instructions: Bring a copy of your healthcare power of attorney and living will documents         the day of surgery if you haven't scanned them before.              Please read over the following fact sheets you were given: IF YOU HAVE QUESTIONS ABOUT YOUR PRE-OP INSTRUCTIONS PLEASE CALL [facility] - Preparing for Surgery Before surgery, you can play an important role.  Because skin is not sterile, your skin needs to  be as free of germs as possible.  You can reduce the number of germs on your skin by washing with CHG (chlorahexidine gluconate) soap before surgery.  CHG is an antiseptic cleaner which kills germs and bonds with the skin to continue killing germs even after washing. Please DO NOT use if you have an allergy to CHG or antibacterial soaps.  If your skin becomes reddened/irritated stop using the CHG and inform your nurse when you arrive at Short Stay. Do not shave (including legs and underarms) for at least 48 hours prior to the first CHG shower.  You may shave your face/neck.  Please follow these instructions carefully:  1.  Shower with CHG Soap the night before surgery and the  morning of surgery.  2.  If you choose to wash your hair, wash your hair first as usual with your normal  shampoo.  3.  After you shampoo, rinse your hair and body thoroughly to remove the shampoo.  4.  Use CHG as you would any other liquid soap.  You can apply chg directly to the skin and wash.  Gently with a scrungie or clean washcloth.  5.  Apply the CHG Soap to your body ONLY FROM THE NECK DOWN.   Do   not use on face/ open                           Wound or open sores. Avoid contact with eyes, ears mouth and   genitals (private parts).                       Wash face,  Genitals (private parts) with your normal soap.             6.  Wash thoroughly, paying special attention to the area where your    surgery  will be performed.  7.  Thoroughly rinse your body with warm water from the neck down.  8.  DO NOT shower/wash with your normal soap after using and rinsing off the CHG Soap.                9.  Pat yourself dry with a clean towel.            10.  Wear clean pajamas.            11.  Place clean sheets on your bed the night of your first shower and do not  sleep with pets. Day of Surgery : Do not apply any lotions/deodorants the morning of surgery.  Please wear clean clothes to the  hospital/surgery center.  FAILURE TO FOLLOW THESE INSTRUCTIONS MAY RESULT IN THE CANCELLATION OF YOUR SURGERY  PATIENT SIGNATURE_________________________________  NURSE SIGNATURE__________________________________  ________________________________________________________________________   Adam Phenix  An incentive spirometer is a tool that can help keep your lungs clear and active. This tool measures how well you are filling your lungs with each breath. Taking long deep breaths may help reverse or decrease the chance of developing breathing (pulmonary) problems (especially infection) following: A long period of time when you are unable to move or be active. BEFORE THE PROCEDURE  If the spirometer includes an indicator to show your best effort, your nurse or respiratory therapist will set it to a desired goal. If possible, sit up straight or lean slightly forward. Try not to slouch. Hold the incentive spirometer in an upright position. INSTRUCTIONS FOR USE  Sit on the edge of your bed if possible, or sit up as far as you can in bed or on a chair. Hold the incentive spirometer in an upright position. Breathe out normally. Place the mouthpiece in your mouth and seal your lips tightly around it. Breathe in slowly and as deeply as possible, raising the piston or the ball toward the top of the column. Hold your breath for 3-5 seconds or for as long as possible. Allow the piston or ball to fall to the bottom of the column. Remove the mouthpiece from your mouth and breathe out normally. Rest for a few seconds and repeat Steps 1 through 7 at least 10 times every 1-2 hours when you are awake. Take your time and take a few normal breaths between deep breaths. The spirometer may include an indicator to show your best effort. Use the indicator as a goal to work toward during each repetition. After each set of 10 deep breaths, practice coughing to be sure your  lungs are clear. If you have an  incision (the cut made at the time of surgery), support your incision when coughing by placing a pillow or rolled up towels firmly against it. Once you are able to get out of bed, walk around indoors and cough well. You may stop using the incentive spirometer when instructed by your caregiver.  RISKS AND COMPLICATIONS Take your time so you do not get dizzy or light-headed. If you are in pain, you may need to take or ask for pain medication before doing incentive spirometry. It is harder to take a deep breath if you are having pain. AFTER USE Rest and breathe slowly and easily. It can be helpful to keep track of a log of your progress. Your caregiver can provide you with a simple table to help with this. If you are using the spirometer at home, follow these instructions: [place] IF:  You are having difficultly using the spirometer. You have trouble using the spirometer as often as instructed. Your pain medication is not giving enough relief while using the spirometer. You develop fever of 100.5 F (38.1 C) or higher. SEEK IMMEDIATE MEDICAL CARE IF:  You cough up bloody sputum that had not been present before. You develop fever of 102 F (38.9 C) or greater. You develop worsening pain at or near the incision site. MAKE SURE YOU:  Understand these instructions. Will watch your condition. Will get help right away if you are not doing well or get worse. Document Released: 03/06/2007 Document Revised: 01/16/2012 Document Reviewed: 05/07/2007 ExitCare Patient Information 2014 [employer], [place].   ________________________________________________________________________  WHAT IS A BLOOD TRANSFUSION? Blood Transfusion Information  A transfusion is the replacement of blood or some of its parts. Blood is made up of multiple cells which provide different functions. Red blood cells carry oxygen and are used for blood loss replacement. White blood cells fight against infection. Platelets  control bleeding. Plasma helps clot blood. Other blood products are available for specialized needs, such as hemophilia or other clotting disorders. BEFORE THE TRANSFUSION  Who gives blood for transfusions?  Healthy volunteers who are fully evaluated to make sure their blood is safe. This is blood bank blood. Transfusion therapy is the safest it has ever been in the practice of medicine. Before blood is taken from a donor, a complete history is taken to make sure that person has no history of diseases nor engages in risky social behavior (examples are intravenous drug use or sexual activity with multiple partners). The donor's travel history is screened to minimize risk of transmitting infections, such as malaria. The donated blood is tested for signs of infectious diseases, such as HIV and hepatitis. The blood is then tested to be sure it is compatible with you in order to minimize the chance of a transfusion reaction. If you or a relative donates blood, this is often done in anticipation of surgery and is not appropriate for emergency situations. It takes many days to process the donated blood. RISKS AND COMPLICATIONS Although transfusion therapy is very safe and saves many lives, the main dangers of transfusion include:  Getting an infectious disease. Developing a transfusion reaction. This is an allergic reaction to something in the blood you were given. Every precaution is taken to prevent this. The decision to have a blood transfusion has been considered carefully by your caregiver before blood is given. Blood is not given unless the benefits outweigh the risks. AFTER THE TRANSFUSION Right after receiving a blood transfusion,  you will usually feel much better and more energetic. This is especially true if your red blood cells have gotten low (anemic). The transfusion raises the level of the red blood cells which carry oxygen, and this usually causes an energy increase. The nurse administering the  transfusion will monitor you carefully for complications. HOME CARE INSTRUCTIONS  No special instructions are needed after a transfusion. You may find your energy is better. Speak with your caregiver about any limitations on activity for underlying diseases you may have. SEEK MEDICAL CARE IF:  Your condition is not improving after your transfusion. You develop redness or irritation at the intravenous (IV) site. SEEK IMMEDIATE MEDICAL CARE IF:  Any of the following symptoms occur over the next 12 hours: Shaking chills. You have a temperature by mouth above 102 F (38.9 C), not controlled by medicine. Chest, back, or muscle pain. People around you feel you are not acting correctly or are confused. Shortness of breath or difficulty breathing. Dizziness and fainting. You get a rash or develop hives. You have a decrease in urine output. Your urine turns a dark color or changes to pink, red, or brown. Any of the following symptoms occur over the next 10 days: You have a temperature by mouth above 102 F (38.9 C), not controlled by medicine. Shortness of breath. Weakness after normal activity. The white part of the eye turns yellow (jaundice). You have a decrease in the amount of urine or are urinating less often. Your urine turns a dark color or changes to pink, red, or brown. Document Released: 10/21/2000 Document Revised: 01/16/2012 Document Reviewed: 06/09/2008 [facility] Patient Information 2014 [place], [place].  _______________________________________________________________________

## 2021-11-29 ENCOUNTER — Encounter (HOSPITAL_COMMUNITY): Payer: Self-pay

## 2021-11-29 ENCOUNTER — Encounter (HOSPITAL_COMMUNITY)
Admission: RE | Admit: 2021-11-29 | Discharge: 2021-11-29 | Disposition: A | Discharge status: Home / Self Care | Payer: Medicare Other | Source: Ambulatory Visit | Attending: Orthopedic Surgery | Admitting: Orthopedic Surgery

## 2021-11-29 ENCOUNTER — Other Ambulatory Visit: Payer: Self-pay

## 2021-11-29 VITALS — BP 128/81 | HR 65 | Temp 98.5°F | Resp 14 | Ht 69.0 in | Wt 214.2 lb

## 2021-11-29 DIAGNOSIS — M1712 Unilateral primary osteoarthritis, left knee: Secondary | ICD-10-CM | POA: Diagnosis not present

## 2021-11-29 DIAGNOSIS — Z01818 Encounter for other preprocedural examination: Secondary | ICD-10-CM | POA: Insufficient documentation

## 2021-11-29 HISTORY — DX: Unspecified asthma, uncomplicated: J45.909

## 2021-11-29 HISTORY — DX: Unspecified osteoarthritis, unspecified site: M19.90

## 2021-11-29 HISTORY — DX: Pure hypercholesterolemia, unspecified: E78.00

## 2021-11-29 LAB — COMPREHENSIVE METABOLIC PANEL
ALT: 32 U/L (ref 0–44)
AST: 26 U/L (ref 15–41)
Albumin: 4.6 g/dL (ref 3.5–5.0)
Alkaline Phosphatase: 52 U/L (ref 38–126)
Anion gap: 7 (ref 5–15)
BUN: 29 mg/dL — ABNORMAL HIGH (ref 8–23)
CO2: 24 mmol/L (ref 22–32)
Calcium: 9.8 mg/dL (ref 8.9–10.3)
Chloride: 106 mmol/L (ref 98–111)
Creatinine, Ser: 0.93 mg/dL (ref 0.61–1.24)
GFR, Estimated: >60 mL/min (ref >60)
Glucose, Bld: 96 mg/dL (ref 70–99)
Potassium: 4.5 mmol/L (ref 3.5–5.1)
Sodium: 137 mmol/L (ref 135–145)
Total Bilirubin: 0.9 mg/dL (ref 0.3–1.2)
Total Protein: 7.5 g/dL (ref 6.5–8.1)

## 2021-11-29 LAB — CBC
HCT: 44 % (ref 39.0–52.0)
Hemoglobin: 15.5 g/dL (ref 13.0–17.0)
MCH: 31.7 pg (ref 26.0–34.0)
MCHC: 35.2 g/dL (ref 30.0–36.0)
MCV: 90 fL (ref 80.0–100.0)
Platelets: 188 10*3/uL (ref 150–400)
RBC: 4.89 MIL/uL (ref 4.22–5.81)
RDW: 13.4 % (ref 11.5–15.5)
WBC: 8.8 10*3/uL (ref 4.0–10.5)
nRBC: 0 % (ref 0.0–0.2)

## 2021-11-30 DIAGNOSIS — L720 Epidermal cyst: Secondary | ICD-10-CM | POA: Diagnosis not present

## 2021-11-30 DIAGNOSIS — L700 Acne vulgaris: Secondary | ICD-10-CM | POA: Diagnosis not present

## 2021-11-30 DIAGNOSIS — Z23 Encounter for immunization: Secondary | ICD-10-CM | POA: Diagnosis not present

## 2021-11-30 DIAGNOSIS — L219 Seborrheic dermatitis, unspecified: Secondary | ICD-10-CM | POA: Diagnosis not present

## 2021-11-30 LAB — SURGICAL PCR SCREEN

## 2021-12-07 ENCOUNTER — Other Ambulatory Visit: Payer: Self-pay

## 2021-12-07 ENCOUNTER — Encounter (HOSPITAL_COMMUNITY)
Admission: RE | Admit: 2021-12-07 | Discharge: 2021-12-07 | Disposition: A | Discharge status: Home / Self Care | Payer: Medicare Other | Source: Ambulatory Visit | Attending: Orthopedic Surgery | Admitting: Orthopedic Surgery

## 2021-12-07 DIAGNOSIS — Z01812 Encounter for preprocedural laboratory examination: Secondary | ICD-10-CM | POA: Insufficient documentation

## 2021-12-07 DIAGNOSIS — Z20822 Contact with and (suspected) exposure to covid-19: Secondary | ICD-10-CM | POA: Insufficient documentation

## 2021-12-07 DIAGNOSIS — L728 Other follicular cysts of the skin and subcutaneous tissue: Secondary | ICD-10-CM | POA: Diagnosis not present

## 2021-12-07 DIAGNOSIS — Z23 Encounter for immunization: Secondary | ICD-10-CM | POA: Diagnosis not present

## 2021-12-07 DIAGNOSIS — Z01818 Encounter for other preprocedural examination: Secondary | ICD-10-CM

## 2021-12-07 DIAGNOSIS — L720 Epidermal cyst: Secondary | ICD-10-CM | POA: Diagnosis not present

## 2021-12-07 LAB — SARS CORONAVIRUS 2 (TAT 6-24 HRS): SARS Coronavirus 2: NEGATIVE

## 2021-12-08 NOTE — Anesthesia Preprocedure Evaluation (Addendum)
Anesthesia Evaluation  Patient identified by MRN, date of birth, ID band Patient awake    Reviewed: Allergy & Precautions, NPO status , Patient's Chart, lab work & pertinent test results  Airway Mallampati: II  TM Distance: >3 FB Neck ROM: Full    Dental no notable dental hx. (+) Teeth Intact, Dental Advisory Given   Pulmonary asthma , former smoker,    Pulmonary exam normal breath sounds clear to auscultation       Cardiovascular hypertension, Pt. on medications Normal cardiovascular exam Rhythm:Regular Rate:Normal     Neuro/Psych    GI/Hepatic negative GI ROS, Neg liver ROS,   Endo/Other  negative endocrine ROS  Renal/GU    Prostate CA    Musculoskeletal  (+) Arthritis , Osteoarthritis,    Abdominal   Peds  Hematology Lab Results      Component                Value               Date                      WBC                      8.8                 11/29/2021                HGB                      15.5                11/29/2021                HCT                      44.0                11/29/2021                MCV                      90.0                11/29/2021                PLT                      188                 11/29/2021             Anesthesia Other Findings All: PCN  Reproductive/Obstetrics                            Anesthesia Physical Anesthesia Plan  ASA: 3  Anesthesia Plan: Spinal   Post-op Pain Management: Minimal or no pain anticipated and Regional block   Induction:   PONV Risk Score and Plan: 1 and Treatment may vary due to age or medical condition  Airway Management Planned: Natural Airway and Nasal Cannula  Additional Equipment: None  Intra-op Plan:   Post-operative Plan:   Informed Consent: I have reviewed the patients History and Physical, chart, labs and discussed the procedure including the risks, benefits and alternatives for the proposed  anesthesia with the patient or  authorized representative who has indicated his/her understanding and acceptance.     Dental advisory given  Plan Discussed with: CRNA and Anesthesiologist  Anesthesia Plan Comments: (LTKA  w L adductor canal)       Anesthesia Quick Evaluation

## 2021-12-08 NOTE — H&P (Signed)
TOTAL KNEE ADMISSION H&P  Patient is being admitted for left total knee arthroplasty.  Subjective:  Chief Complaint:left knee pain.  HPI: Leonard Burke, 71 y.o. male, has a history of pain and functional disability in the left knee due to arthritis and has failed non-surgical conservative treatments for greater than 12 weeks to includeNSAID's and/or analgesics, corticosteriod injections, and activity modification.  Onset of symptoms was gradual, starting 2 years ago with gradually worsening course since that time. The patient noted no past surgery on the left knee(s).  Patient currently rates pain in the left knee(s) at 7 out of 10 with activity. Patient has worsening of pain with activity and weight bearing and pain that interferes with activities of daily living.  Patient has evidence of joint space narrowing by imaging studies.  There is no active infection.  Patient Active Problem List   Diagnosis Date Noted   Malignant neoplasm of prostate ([place]) 06/23/2021   HTN (hypertension)    Hypertriglyceridemia    Past Medical History:  Diagnosis Date   Arthritis    Asthma    HTN (hypertension)    Hypercholesteremia    Hypertriglyceridemia     Past Surgical History:  Procedure Laterality Date   COLONOSCOPY     CYST EXCISION     KNEE ARTHROSCOPY     SHOULDER ARTHROSCOPY Bilateral     No current facility-administered medications for this encounter.   Current Outpatient Medications  Medication Sig Dispense Refill Last Dose   amLODipine (NORVASC) 10 MG tablet Take 10 mg by mouth daily.      atenolol (TENORMIN) 25 MG tablet TAKE 1 TABLET BY MOUTH EVERY DAY (Patient taking differently: Take 50 mg by mouth daily.) 30 tablet 3    diclofenac (VOLTAREN) 75 MG EC tablet Take 75 mg by mouth daily.      fenofibrate (TRICOR) 145 MG tablet Take 1 tablet (145 mg total) by mouth daily. 90 tablet 3    Naphazoline-Polyethyl Glycol (EYE DROPS) 0.012-0.2 % SOLN Place 1 drop into both eyes daily as  needed (Allergies/dry eyes).      olmesartan (BENICAR) 20 MG tablet Take 1 tablet (20 mg total) by mouth daily. 30 tablet 11    rosuvastatin (CRESTOR) 5 MG tablet Take 5 mg by mouth daily.      VENTOLIN HFA 108 (90 Base) MCG/ACT inhaler Inhale 90 mcg into the lungs every 4 (four) hours as needed for wheezing or shortness of breath.  1    zolpidem (AMBIEN) 10 MG tablet Take 10 mg by mouth daily as needed for sleep.      Allergies  Allergen Reactions   Codeine Nausea And Vomiting   Penicillins Rash   Ivp Dye [Iodinated Contrast Media] Hives    Social History   Tobacco Use   Smoking status: Former    Types: Cigarettes   Smokeless tobacco: Never  Substance Use Topics   Alcohol use: Not Currently    Comment: Occasional    Family History  Problem Relation Age of Onset   Other Neg Hx        hypertriglyceridemia     Review of Systems  Constitutional:  Negative for chills and fever.  Respiratory:  Negative for cough and shortness of breath.   Cardiovascular:  Negative for chest pain.  Gastrointestinal:  Negative for nausea and vomiting.  Musculoskeletal:  Positive for arthralgias.    Objective:  Physical Exam Well nourished and well developed. General: Alert and oriented x3, cooperative and pleasant, no acute  distress. Head: normocephalic, atraumatic, neck supple. Eyes: EOMI.  Musculoskeletal: Bilateral knee exams: Significant bilateral genu varum associated with 5 degree flexion contracture Tenderness over the medial and anterior aspect of the knees No palpable effusion, warmth or erythema No lower extremity edema or erythema  Calves soft and nontender. Motor function intact in LE. Strength 5/5 LE bilaterally. Neuro: Distal pulses 2+. Sensation to light touch intact in LE.  Vital signs in last 24 hours:    Labs:   Estimated body mass index is 31.63 kg/m as calculated from the following:   Height as of 11/29/21: 5\' 9"  (1.753 m).   Weight as of 11/29/21: 97.2  kg.   Imaging Review Plain radiographs demonstrate severe degenerative joint disease of the left knee(s). The overall alignment isneutral. The bone quality appears to be adequate for age and reported activity level.      Assessment/Plan:  End stage arthritis, left knee   The patient history, physical examination, clinical judgment of the provider and imaging studies are consistent with end stage degenerative joint disease of the left knee(s) and total knee arthroplasty is deemed medically necessary. The treatment options including medical management, injection therapy arthroscopy and arthroplasty were discussed at length. The risks and benefits of total knee arthroplasty were presented and reviewed. The risks due to aseptic loosening, infection, stiffness, patella tracking problems, thromboembolic complications and other imponderables were discussed. The patient acknowledged the explanation, agreed to proceed with the plan and consent was signed. Patient is being admitted for inpatient treatment for surgery, pain control, PT, OT, prophylactic antibiotics, VTE prophylaxis, progressive ambulation and ADL's and discharge planning. The patient is planning to be discharged  home.  Therapy Plans: outpatient therapy at [facility] in [place] Disposition: Home with daughter Planned DVT Prophylaxis: aspirin 81mg  BID DME needed: none PCP: Dr. Orpah Melter, clearance received TXA: IV Allergies: PCN - childhood rxn, Contrast - hives Anesthesia Concerns: none BMI: 32.2 Last HgbA1c: Not diabetic  Other: - Going to discuss with his sister and daughter - only plans on them staying one night. Going to discuss need for rides to PT with them - Dilaudid (N/V with norco), robaxin, celebrex, tylenol ~    Patient's anticipated LOS is less than 2 midnights, meeting these requirements: - Younger than 57 - Lives within 1 hour of care - Has a competent adult at home to recover with post-op recover -  NO history of  - Chronic pain requiring opiods  - Diabetes  - Coronary Artery Disease  - Heart failure  - Heart attack  - Stroke  - DVT/VTE  - Cardiac arrhythmia  - Respiratory Failure/COPD  - Renal failure  - Anemia  - Advanced Liver disease  Costella Hatcher, PA-C Orthopedic Surgery [facility] [place] [phone]

## 2021-12-09 ENCOUNTER — Encounter (HOSPITAL_COMMUNITY)
Admission: RE | Disposition: A | Discharge status: Home / Self Care | Payer: Self-pay | Source: Ambulatory Visit | Attending: Orthopedic Surgery

## 2021-12-09 ENCOUNTER — Observation Stay (HOSPITAL_COMMUNITY)
Admission: RE | Admit: 2021-12-09 | Discharge: 2021-12-10 | Disposition: A | Discharge status: Home / Self Care | Payer: Medicare Other | Source: Ambulatory Visit | Attending: Orthopedic Surgery | Admitting: Orthopedic Surgery

## 2021-12-09 ENCOUNTER — Ambulatory Visit (HOSPITAL_COMMUNITY): Payer: Medicare Other | Admitting: Anesthesiology

## 2021-12-09 ENCOUNTER — Ambulatory Visit (HOSPITAL_COMMUNITY): Payer: Medicare Other | Admitting: Physician Assistant

## 2021-12-09 ENCOUNTER — Encounter (HOSPITAL_COMMUNITY): Payer: Self-pay | Admitting: Orthopedic Surgery

## 2021-12-09 DIAGNOSIS — I10 Essential (primary) hypertension: Secondary | ICD-10-CM | POA: Diagnosis not present

## 2021-12-09 DIAGNOSIS — M65162 Other infective (teno)synovitis, left knee: Secondary | ICD-10-CM | POA: Diagnosis not present

## 2021-12-09 DIAGNOSIS — Z01818 Encounter for other preprocedural examination: Secondary | ICD-10-CM

## 2021-12-09 DIAGNOSIS — G8918 Other acute postprocedural pain: Secondary | ICD-10-CM | POA: Diagnosis not present

## 2021-12-09 DIAGNOSIS — Z96652 Presence of left artificial knee joint: Secondary | ICD-10-CM

## 2021-12-09 DIAGNOSIS — Z8546 Personal history of malignant neoplasm of prostate: Secondary | ICD-10-CM | POA: Diagnosis not present

## 2021-12-09 DIAGNOSIS — M1712 Unilateral primary osteoarthritis, left knee: Secondary | ICD-10-CM | POA: Diagnosis not present

## 2021-12-09 DIAGNOSIS — Z79899 Other long term (current) drug therapy: Secondary | ICD-10-CM | POA: Diagnosis not present

## 2021-12-09 DIAGNOSIS — Z87891 Personal history of nicotine dependence: Secondary | ICD-10-CM | POA: Diagnosis not present

## 2021-12-09 DIAGNOSIS — J45909 Unspecified asthma, uncomplicated: Secondary | ICD-10-CM | POA: Insufficient documentation

## 2021-12-09 DIAGNOSIS — C61 Malignant neoplasm of prostate: Secondary | ICD-10-CM | POA: Diagnosis not present

## 2021-12-09 HISTORY — PX: TOTAL KNEE ARTHROPLASTY: SHX125

## 2021-12-09 LAB — TYPE AND SCREEN
ABO/RH(D): O NEG
Antibody Screen: NEGATIVE

## 2021-12-09 LAB — SURGICAL PCR SCREEN
MRSA, PCR: NEGATIVE
Staphylococcus aureus: NEGATIVE

## 2021-12-09 LAB — ABO/RH: ABO/RH(D): O NEG

## 2021-12-09 SURGERY — ARTHROPLASTY, KNEE, TOTAL
Anesthesia: Spinal | Site: Knee | Laterality: Left

## 2021-12-09 MED ORDER — METHOCARBAMOL 500 MG IVPB - SIMPLE MED
500.0000 mg | Freq: Four times a day (QID) | INTRAVENOUS | Status: DC | PRN
  Administered 2021-12-09: 500 mg via INTRAVENOUS
  Filled 2021-12-09: qty 50

## 2021-12-09 MED ORDER — HYDROMORPHONE HCL 2 MG PO TABS
2.0000 mg | ORAL_TABLET | ORAL | Status: DC | PRN
  Administered 2021-12-09 – 2021-12-10 (×3): 2 mg via ORAL
  Administered 2021-12-10: 4 mg via ORAL
  Filled 2021-12-09 (×4): qty 1

## 2021-12-09 MED ORDER — IRBESARTAN 150 MG PO TABS
150.0000 mg | ORAL_TABLET | Freq: Every day | ORAL | Status: DC
  Administered 2021-12-10: 150 mg via ORAL
  Filled 2021-12-09: qty 1

## 2021-12-09 MED ORDER — PROPOFOL 500 MG/50ML IV EMUL
INTRAVENOUS | Status: DC | PRN
Start: 2021-12-09 — End: 2021-12-09
  Administered 2021-12-09: 130 ug/kg/min via INTRAVENOUS

## 2021-12-09 MED ORDER — PHENOL 1.4 % MT LIQD: 1.0000 | OROMUCOSAL | Status: DC | PRN

## 2021-12-09 MED ORDER — ONDANSETRON HCL 4 MG/2ML IJ SOLN: 4.0000 mg | Freq: Once | INTRAMUSCULAR | Status: DC | PRN

## 2021-12-09 MED ORDER — DIPHENHYDRAMINE HCL 12.5 MG/5ML PO ELIX: 12.5000 mg | ORAL_SOLUTION | ORAL | Status: DC | PRN

## 2021-12-09 MED ORDER — FENTANYL CITRATE PF 50 MCG/ML IJ SOSY
PREFILLED_SYRINGE | INTRAMUSCULAR | Status: AC
  Filled 2021-12-09: qty 1

## 2021-12-09 MED ORDER — 0.9 % SODIUM CHLORIDE (POUR BTL) OPTIME
TOPICAL | Status: DC | PRN
Start: 2021-12-09 — End: 2021-12-09
  Administered 2021-12-09: 1000 mL

## 2021-12-09 MED ORDER — ALBUTEROL SULFATE (2.5 MG/3ML) 0.083% IN NEBU: 3.0000 mL | INHALATION_SOLUTION | RESPIRATORY_TRACT | Status: DC | PRN

## 2021-12-09 MED ORDER — TRANEXAMIC ACID-NACL 1000-0.7 MG/100ML-% IV SOLN
1000.0000 mg | Freq: Once | INTRAVENOUS | Status: AC
  Administered 2021-12-09: 1000 mg via INTRAVENOUS
  Filled 2021-12-09: qty 100

## 2021-12-09 MED ORDER — STERILE WATER FOR IRRIGATION IR SOLN
Status: DC | PRN
  Administered 2021-12-09: 2000 mL

## 2021-12-09 MED ORDER — BUPIVACAINE IN DEXTROSE 0.75-8.25 % IT SOLN
INTRATHECAL | Status: DC | PRN
Start: 2021-12-09 — End: 2021-12-09
  Administered 2021-12-09: 1.6 mL via INTRATHECAL

## 2021-12-09 MED ORDER — HYDROMORPHONE HCL 1 MG/ML IJ SOLN: 0.5000 mg | INTRAMUSCULAR | Status: DC | PRN

## 2021-12-09 MED ORDER — SODIUM CHLORIDE 0.9 % IR SOLN
Status: DC | PRN
Start: 2021-12-09 — End: 2021-12-09
  Administered 2021-12-09: 1000 mL

## 2021-12-09 MED ORDER — FENOFIBRATE 54 MG PO TABS
54.0000 mg | ORAL_TABLET | Freq: Every day | ORAL | Status: DC
  Administered 2021-12-09 – 2021-12-10 (×2): 54 mg via ORAL
  Filled 2021-12-09 (×2): qty 1

## 2021-12-09 MED ORDER — ACETAMINOPHEN 325 MG PO TABS: 325.0000 mg | ORAL_TABLET | Freq: Four times a day (QID) | ORAL | Status: DC | PRN

## 2021-12-09 MED ORDER — PROPOFOL 1000 MG/100ML IV EMUL
INTRAVENOUS | Status: AC
  Filled 2021-12-09: qty 100

## 2021-12-09 MED ORDER — ACETAMINOPHEN 10 MG/ML IV SOLN
INTRAVENOUS | Status: AC
  Filled 2021-12-09: qty 100

## 2021-12-09 MED ORDER — BISACODYL 10 MG RE SUPP: 10.0000 mg | Freq: Every day | RECTAL | Status: DC | PRN

## 2021-12-09 MED ORDER — ROSUVASTATIN CALCIUM 5 MG PO TABS
5.0000 mg | ORAL_TABLET | Freq: Every day | ORAL | Status: DC
Start: 2021-12-09 — End: 2021-12-10
  Administered 2021-12-09 – 2021-12-10 (×2): 5 mg via ORAL
  Filled 2021-12-09 (×3): qty 1

## 2021-12-09 MED ORDER — ATENOLOL 50 MG PO TABS
50.0000 mg | ORAL_TABLET | Freq: Every day | ORAL | Status: DC
  Administered 2021-12-10: 50 mg via ORAL
  Filled 2021-12-09: qty 1

## 2021-12-09 MED ORDER — SODIUM CHLORIDE 0.9 % IV SOLN: INTRAVENOUS | Status: DC

## 2021-12-09 MED ORDER — DEXAMETHASONE SODIUM PHOSPHATE 10 MG/ML IJ SOLN
8.0000 mg | Freq: Once | INTRAMUSCULAR | Status: AC
  Administered 2021-12-09: 8 mg via INTRAVENOUS

## 2021-12-09 MED ORDER — ACETAMINOPHEN 10 MG/ML IV SOLN
1000.0000 mg | Freq: Once | INTRAVENOUS | Status: DC | PRN
  Administered 2021-12-09: 1000 mg via INTRAVENOUS

## 2021-12-09 MED ORDER — FENTANYL CITRATE PF 50 MCG/ML IJ SOSY
50.0000 ug | PREFILLED_SYRINGE | INTRAMUSCULAR | Status: DC
  Administered 2021-12-09: 50 ug via INTRAVENOUS
  Filled 2021-12-09: qty 2

## 2021-12-09 MED ORDER — ORAL CARE MOUTH RINSE: 15.0000 mL | Freq: Once | OROMUCOSAL | Status: AC

## 2021-12-09 MED ORDER — ONDANSETRON HCL 4 MG PO TABS: 4.0000 mg | ORAL_TABLET | Freq: Four times a day (QID) | ORAL | Status: DC | PRN

## 2021-12-09 MED ORDER — METHOCARBAMOL 500 MG IVPB - SIMPLE MED
INTRAVENOUS | Status: AC
  Filled 2021-12-09: qty 50

## 2021-12-09 MED ORDER — ONDANSETRON HCL 4 MG/2ML IJ SOLN
INTRAMUSCULAR | Status: AC
  Filled 2021-12-09: qty 2

## 2021-12-09 MED ORDER — CEFAZOLIN SODIUM-DEXTROSE 2-4 GM/100ML-% IV SOLN
2.0000 g | Freq: Four times a day (QID) | INTRAVENOUS | Status: AC
  Administered 2021-12-09 (×2): 2 g via INTRAVENOUS
  Filled 2021-12-09 (×2): qty 100

## 2021-12-09 MED ORDER — LACTATED RINGERS IV SOLN: INTRAVENOUS | Status: DC

## 2021-12-09 MED ORDER — TOBRAMYCIN SULFATE 1.2 G IJ SOLR
INTRAMUSCULAR | Status: AC
  Filled 2021-12-09: qty 1.2

## 2021-12-09 MED ORDER — EPHEDRINE 5 MG/ML INJ
INTRAVENOUS | Status: AC
  Filled 2021-12-09: qty 5

## 2021-12-09 MED ORDER — AMLODIPINE BESYLATE 10 MG PO TABS
10.0000 mg | ORAL_TABLET | Freq: Every day | ORAL | Status: DC
  Administered 2021-12-10: 10 mg via ORAL
  Filled 2021-12-09: qty 1

## 2021-12-09 MED ORDER — POLYETHYLENE GLYCOL 3350 17 G PO PACK: 17.0000 g | PACK | Freq: Every day | ORAL | Status: DC | PRN

## 2021-12-09 MED ORDER — DOCUSATE SODIUM 100 MG PO CAPS
100.0000 mg | ORAL_CAPSULE | Freq: Two times a day (BID) | ORAL | Status: DC
Start: 2021-12-09 — End: 2021-12-10
  Administered 2021-12-09 – 2021-12-10 (×3): 100 mg via ORAL
  Filled 2021-12-09 (×3): qty 1

## 2021-12-09 MED ORDER — KETOROLAC TROMETHAMINE 30 MG/ML IJ SOLN
INTRAMUSCULAR | Status: AC
  Filled 2021-12-09: qty 1

## 2021-12-09 MED ORDER — CEFAZOLIN SODIUM-DEXTROSE 2-4 GM/100ML-% IV SOLN
2.0000 g | INTRAVENOUS | Status: AC
  Administered 2021-12-09: 2 g via INTRAVENOUS
  Filled 2021-12-09: qty 100

## 2021-12-09 MED ORDER — BUPIVACAINE-EPINEPHRINE (PF) 0.25% -1:200000 IJ SOLN
INTRAMUSCULAR | Status: DC | PRN
  Administered 2021-12-09: 30 mL

## 2021-12-09 MED ORDER — FENTANYL CITRATE PF 50 MCG/ML IJ SOSY
25.0000 ug | PREFILLED_SYRINGE | INTRAMUSCULAR | Status: DC | PRN
  Administered 2021-12-09 (×2): 25 ug via INTRAVENOUS

## 2021-12-09 MED ORDER — ONDANSETRON HCL 4 MG/2ML IJ SOLN: 4.0000 mg | Freq: Four times a day (QID) | INTRAMUSCULAR | Status: DC | PRN

## 2021-12-09 MED ORDER — FERROUS SULFATE 325 (65 FE) MG PO TABS
325.0000 mg | ORAL_TABLET | Freq: Three times a day (TID) | ORAL | Status: DC
  Administered 2021-12-09 – 2021-12-10 (×3): 325 mg via ORAL
  Filled 2021-12-09 (×3): qty 1

## 2021-12-09 MED ORDER — METOCLOPRAMIDE HCL 5 MG/ML IJ SOLN: 5.0000 mg | Freq: Three times a day (TID) | INTRAMUSCULAR | Status: DC | PRN

## 2021-12-09 MED ORDER — ZOLPIDEM TARTRATE 10 MG PO TABS: 5.0000 mg | ORAL_TABLET | Freq: Every day | ORAL | Status: DC | PRN

## 2021-12-09 MED ORDER — METOCLOPRAMIDE HCL 5 MG PO TABS: 5.0000 mg | ORAL_TABLET | Freq: Three times a day (TID) | ORAL | Status: DC | PRN

## 2021-12-09 MED ORDER — CELECOXIB 200 MG PO CAPS
200.0000 mg | ORAL_CAPSULE | Freq: Two times a day (BID) | ORAL | Status: DC
  Administered 2021-12-09 – 2021-12-10 (×3): 200 mg via ORAL
  Filled 2021-12-09 (×3): qty 1

## 2021-12-09 MED ORDER — ASPIRIN 81 MG PO CHEW
81.0000 mg | CHEWABLE_TABLET | Freq: Two times a day (BID) | ORAL | Status: DC
  Administered 2021-12-09 – 2021-12-10 (×2): 81 mg via ORAL
  Filled 2021-12-09 (×2): qty 1

## 2021-12-09 MED ORDER — DEXAMETHASONE SODIUM PHOSPHATE 10 MG/ML IJ SOLN
INTRAMUSCULAR | Status: AC
  Filled 2021-12-09: qty 1

## 2021-12-09 MED ORDER — BUPIVACAINE-EPINEPHRINE (PF) 0.25% -1:200000 IJ SOLN
INTRAMUSCULAR | Status: AC
  Filled 2021-12-09: qty 30

## 2021-12-09 MED ORDER — ROPIVACAINE HCL 7.5 MG/ML IJ SOLN
INTRAMUSCULAR | Status: DC | PRN
  Administered 2021-12-09: 20 mL via PERINEURAL

## 2021-12-09 MED ORDER — MENTHOL 3 MG MT LOZG: 1.0000 | LOZENGE | OROMUCOSAL | Status: DC | PRN

## 2021-12-09 MED ORDER — VANCOMYCIN HCL 1000 MG IV SOLR
INTRAVENOUS | Status: AC
  Filled 2021-12-09: qty 20

## 2021-12-09 MED ORDER — DEXAMETHASONE SODIUM PHOSPHATE 10 MG/ML IJ SOLN
10.0000 mg | Freq: Once | INTRAMUSCULAR | Status: AC
  Administered 2021-12-10: 10 mg via INTRAVENOUS
  Filled 2021-12-09: qty 1

## 2021-12-09 MED ORDER — KETOROLAC TROMETHAMINE 30 MG/ML IJ SOLN
INTRAMUSCULAR | Status: DC | PRN
  Administered 2021-12-09: 30 mg

## 2021-12-09 MED ORDER — PHENYLEPHRINE 40 MCG/ML (10ML) SYRINGE FOR IV PUSH (FOR BLOOD PRESSURE SUPPORT)
PREFILLED_SYRINGE | INTRAVENOUS | Status: DC | PRN
  Administered 2021-12-09 (×2): 80 ug via INTRAVENOUS

## 2021-12-09 MED ORDER — SODIUM CHLORIDE (PF) 0.9 % IJ SOLN
INTRAMUSCULAR | Status: DC | PRN
  Administered 2021-12-09: 30 mL

## 2021-12-09 MED ORDER — PHENYLEPHRINE 40 MCG/ML (10ML) SYRINGE FOR IV PUSH (FOR BLOOD PRESSURE SUPPORT)
PREFILLED_SYRINGE | INTRAVENOUS | Status: AC
  Filled 2021-12-09: qty 10

## 2021-12-09 MED ORDER — POVIDONE-IODINE 10 % EX SWAB
2.0000 "application " | Freq: Once | CUTANEOUS | Status: AC
  Administered 2021-12-09: 2 via TOPICAL

## 2021-12-09 MED ORDER — ONDANSETRON HCL 4 MG/2ML IJ SOLN
INTRAMUSCULAR | Status: DC | PRN
  Administered 2021-12-09: 4 mg via INTRAVENOUS

## 2021-12-09 MED ORDER — MIDAZOLAM HCL 2 MG/2ML IJ SOLN
1.0000 mg | INTRAMUSCULAR | Status: DC
  Administered 2021-12-09: 1 mg via INTRAVENOUS
  Filled 2021-12-09: qty 2

## 2021-12-09 MED ORDER — METHOCARBAMOL 500 MG PO TABS
500.0000 mg | ORAL_TABLET | Freq: Four times a day (QID) | ORAL | Status: DC | PRN
  Administered 2021-12-09 – 2021-12-10 (×3): 500 mg via ORAL
  Filled 2021-12-09 (×3): qty 1

## 2021-12-09 MED ORDER — EPHEDRINE SULFATE-NACL 50-0.9 MG/10ML-% IV SOSY
PREFILLED_SYRINGE | INTRAVENOUS | Status: DC | PRN
  Administered 2021-12-09: 10 mg via INTRAVENOUS
  Administered 2021-12-09: 5 mg via INTRAVENOUS
  Administered 2021-12-09: 10 mg via INTRAVENOUS

## 2021-12-09 MED ORDER — PROPOFOL 10 MG/ML IV BOLUS
INTRAVENOUS | Status: DC | PRN
  Administered 2021-12-09: 50 mg via INTRAVENOUS

## 2021-12-09 MED ORDER — CHLORHEXIDINE GLUCONATE CLOTH 2 % EX PADS
6.0000 | MEDICATED_PAD | Freq: Every day | CUTANEOUS | Status: DC
  Administered 2021-12-09: 6 via TOPICAL

## 2021-12-09 MED ORDER — CHLORHEXIDINE GLUCONATE 0.12 % MT SOLN
15.0000 mL | Freq: Once | OROMUCOSAL | Status: AC
  Administered 2021-12-09: 15 mL via OROMUCOSAL

## 2021-12-09 MED ORDER — TRANEXAMIC ACID-NACL 1000-0.7 MG/100ML-% IV SOLN
1000.0000 mg | INTRAVENOUS | Status: AC
  Administered 2021-12-09: 1000 mg via INTRAVENOUS
  Filled 2021-12-09: qty 100

## 2021-12-09 SURGICAL SUPPLY — 54 items
ADH SKN CLS APL DERMABOND .7 (GAUZE/BANDAGES/DRESSINGS) ×1
ATTUNE MED ANAT PAT 38 KNEE (Knees) ×1 IMPLANT
ATTUNE PS FEM LT SZ 7 CEM KNEE (Femur) ×1 IMPLANT
ATTUNE PSRP INSR SZ7 10 KNEE (Insert) ×1 IMPLANT
BAG COUNTER SPONGE SURGICOUNT (BAG) IMPLANT
BAG SPEC THK2 15X12 ZIP CLS (MISCELLANEOUS)
BAG SPNG CNTER NS LX DISP (BAG)
BAG ZIPLOCK 12X15 (MISCELLANEOUS) IMPLANT
BASE TIBIAL ROT PLAT SZ 7 KNEE (Knees) IMPLANT
BLADE SAW SGTL 11.0X1.19X90.0M (BLADE) IMPLANT
BLADE SAW SGTL 13.0X1.19X90.0M (BLADE) ×2 IMPLANT
BLADE SURG SZ10 CARB STEEL (BLADE) ×4 IMPLANT
BNDG ELASTIC 6X5.8 VLCR STR LF (GAUZE/BANDAGES/DRESSINGS) ×2 IMPLANT
BOWL SMART MIX CTS (DISPOSABLE) ×2 IMPLANT
CEMENT HV SMART SET (Cement) ×2 IMPLANT
CUFF TOURN SGL QUICK 34 (TOURNIQUET CUFF) ×2
CUFF TRNQT CYL 34X4.125X (TOURNIQUET CUFF) ×1 IMPLANT
DERMABOND ADVANCED (GAUZE/BANDAGES/DRESSINGS) ×1
DERMABOND ADVANCED .7 DNX12 (GAUZE/BANDAGES/DRESSINGS) ×1 IMPLANT
DRAPE INCISE IOBAN 66X45 STRL (DRAPES) ×2 IMPLANT
DRAPE U-SHAPE 47X51 STRL (DRAPES) ×2 IMPLANT
DRESSING AQUACEL AG SP 3.5X10 (GAUZE/BANDAGES/DRESSINGS) ×1 IMPLANT
DRSG AQUACEL AG SP 3.5X10 (GAUZE/BANDAGES/DRESSINGS) ×2
DURAPREP 26ML APPLICATOR (WOUND CARE) ×4 IMPLANT
ELECT REM PT RETURN 15FT ADLT (MISCELLANEOUS) ×2 IMPLANT
GAUZE 4X4 16PLY ~~LOC~~+RFID DBL (SPONGE) IMPLANT
GLOVE SURG ENC MOIS LTX SZ6 (GLOVE) ×2 IMPLANT
GLOVE SURG ENC MOIS LTX SZ7 (GLOVE) ×2 IMPLANT
GLOVE SURG UNDER POLY LF SZ7.5 (GLOVE) ×2 IMPLANT
GOWN STRL REUS W/TWL LRG LVL3 (GOWN DISPOSABLE) ×2 IMPLANT
HANDPIECE INTERPULSE COAX TIP (DISPOSABLE) ×2
HOLDER FOLEY CATH W/STRAP (MISCELLANEOUS) IMPLANT
KIT TURNOVER KIT A (KITS) IMPLANT
MANIFOLD NEPTUNE II (INSTRUMENTS) ×2 IMPLANT
NDL SAFETY ECLIPSE 18X1.5 (NEEDLE) IMPLANT
NEEDLE HYPO 18GX1.5 SHARP (NEEDLE)
NS IRRIG 1000ML POUR BTL (IV SOLUTION) ×2 IMPLANT
PACK TOTAL KNEE CUSTOM (KITS) ×2 IMPLANT
PROTECTOR NERVE ULNAR (MISCELLANEOUS) ×2 IMPLANT
SET HNDPC FAN SPRY TIP SCT (DISPOSABLE) ×1 IMPLANT
SET PAD KNEE POSITIONER (MISCELLANEOUS) ×2 IMPLANT
SPIKE FLUID TRANSFER (MISCELLANEOUS) ×4 IMPLANT
SPONGE T-LAP 18X18 ~~LOC~~+RFID (SPONGE) ×6 IMPLANT
SUT MNCRL AB 4-0 PS2 18 (SUTURE) ×2 IMPLANT
SUT STRATAFIX PDS+ 0 24IN (SUTURE) ×2 IMPLANT
SUT VIC AB 1 CT1 36 (SUTURE) ×2 IMPLANT
SUT VIC AB 2-0 CT1 27 (SUTURE) ×6
SUT VIC AB 2-0 CT1 TAPERPNT 27 (SUTURE) ×3 IMPLANT
SYR 3ML LL SCALE MARK (SYRINGE) ×2 IMPLANT
TIBIAL BASE ROT PLAT SZ 7 KNEE (Knees) ×2 IMPLANT
TRAY FOLEY MTR SLVR 16FR STAT (SET/KITS/TRAYS/PACK) ×2 IMPLANT
TUBE SUCTION HIGH CAP CLEAR NV (SUCTIONS) ×2 IMPLANT
WATER STERILE IRR 1000ML POUR (IV SOLUTION) ×4 IMPLANT
WRAP KNEE MAXI GEL POST OP (GAUZE/BANDAGES/DRESSINGS) ×2 IMPLANT

## 2021-12-09 NOTE — Interval H&P Note (Signed)
History and Physical Interval Note:  12/09/2021 7:09 AM  Leonard Burke  has presented today for surgery, with the diagnosis of Left knee osteoarthritis.  The various methods of treatment have been discussed with the patient and family. After consideration of risks, benefits and other options for treatment, the patient has consented to  Procedure(s): TOTAL KNEE ARTHROPLASTY (Left) as a surgical intervention.  The patient's history has been reviewed, patient examined, no change in status, stable for surgery.  I have reviewed the patient's chart and labs.  Questions were answered to the patient's satisfaction.     Mauri Pole

## 2021-12-09 NOTE — Op Note (Signed)
NAME:  [place] RECORD NO.:  [id number]                             FACILITY:  [facility]      PHYSICIAN:  Pietro Cassis. Alvan Dame, M.D.  DATE OF BIRTH:  02-17-1951      DATE OF PROCEDURE:  12/09/2021                                     OPERATIVE REPORT         PREOPERATIVE DIAGNOSIS:  Left knee osteoarthritis.      POSTOPERATIVE DIAGNOSIS:  Left knee osteoarthritis.      FINDINGS:  The patient was noted to have complete loss of cartilage and   bone-on-bone arthritis with associated osteophytes in the medial and patellofemoral compartments of   the knee with a significant synovitis and associated effusion.  The patient had failed months of conservative treatment including medications, injection therapy, activity modification.     PROCEDURE:  Left total knee replacement.      COMPONENTS USED:  DePuy Attune rotating platform posterior stabilized knee   system, a size 7 femur, 7 tibia, size 10 mm PS AOX insert, and 38 anatomic patellar   button.      SURGEON:  Pietro Cassis. Alvan Dame, M.D.      ASSISTANT:  Costella Hatcher, PA-C.      ANESTHESIA:  Regional and Spinal.      SPECIMENS:  None.      COMPLICATION:  None.      DRAINS:  None.  EBL: <100 cc      TOURNIQUET TIME:   Total Tourniquet Time Documented: Thigh (Left) - 32 minutes Total: Thigh (Left) - 32 minutes  .      The patient was stable to the recovery room.      INDICATION FOR PROCEDURE:  Leonard Burke is a 71 y.o. male patient of   mine.  The patient had been seen, evaluated, and treated for months conservatively in the   office with medication, activity modification, and injections.  The patient had   radiographic changes of bone-on-bone arthritis with endplate sclerosis and osteophytes noted.  Based on the radiographic changes and failed conservative measures, the patient   decided to proceed with definitive treatment, total knee replacement.  Risks of infection, DVT, component failure,  need for revision surgery, neurovascular injury were reviewed in the office setting.  The postop course was reviewed stressing the efforts to maximize post-operative satisfaction and function.  Consent was obtained for benefit of pain   relief.      PROCEDURE IN DETAIL:  The patient was brought to the operative theater.   Once adequate anesthesia, preoperative antibiotics, 2 gm of Ancef,1 gm of Tranexamic Acid, and 10 mg of Decadron administered, the patient was positioned supine with a left thigh tourniquet placed.  The  left lower extremity was prepped and draped in sterile fashion.  A time-   out was performed identifying the patient, planned procedure, and the appropriate extremity.      The left lower extremity was placed in the [facility] leg holder.  The leg was   exsanguinated, tourniquet elevated to 250 mmHg.  A midline incision was  made followed by median parapatellar arthrotomy.  Following initial   exposure, attention was first directed to the patella.  Precut   measurement was noted to be 26 mm.  I resected down to 14 mm and used a   38 anatomic patellar button to restore patellar height as well as cover the cut surface.      The lug holes were drilled and a metal shim was placed to protect the   patella from retractors and saw blade during the procedure.      At this point, attention was now directed to the femur.  The femoral   canal was opened with a drill, irrigated to try to prevent fat emboli.  An   intramedullary rod was passed at 5 degrees valgus, 9 mm of bone was   resected off the distal femur.  Following this resection, the tibia was   subluxated anteriorly.  Using the extramedullary guide, 2 mm of bone was resected off   the proximal medial tibia.  We confirmed the gap would be   stable medially and laterally with a size 6 spacer block as well as confirmed that the tibial cut was perpendicular in the coronal plane, checking with an alignment rod.      Once this was  done, I sized the femur to be a size 7 in the anterior-   posterior dimension, chose a standard component based on medial and   lateral dimension.  The size 7 rotation block was then pinned in   position anterior referenced using the C-clamp to set rotation.  The   anterior, posterior, and  chamfer cuts were made without difficulty nor   notching making certain that I was along the anterior cortex to help   with flexion gap stability.      The final box cut was made off the lateral aspect of distal femur.      At this point, the tibia was sized to be a size 7.  The size 7 tray was   then pinned in position through the medial third of the tubercle,   drilled, and keel punched.  Trial reduction was now carried with a 7 femur,  7 tibia, a size 10 mm PS insert, and the 38 anatomic patella botton.  The knee was brought to full extension with good flexion stability with the patella   tracking through the trochlea without application of pressure.  Given   all these findings the trial components removed.  Final components were   opened and cement was mixed.  The knee was irrigated with normal saline solution and pulse lavage.  The synovial lining was   then injected with 30 cc of 0.25% Marcaine with epinephrine, 1 cc of Toradol and 30 cc of NS for a total of 61 cc.     Final implants were then cemented onto cleaned and dried cut surfaces of bone with the knee brought to extension with a size 10 mm PS trial insert.      Once the cement had fully cured, excess cement was removed   throughout the knee.  I confirmed that I was satisfied with the range of   motion and stability, and the final size 10 mm PS AOX insert was chosen.  It was   placed into the knee.      The tourniquet had been let down at 32 minutes.  No significant   hemostasis was required.  The extensor mechanism was then reapproximated using #1 Vicryl  and #1 Stratafix sutures with the knee   in flexion.  The   remaining wound was  closed with 2-0 Vicryl and running 4-0 Monocryl.   The knee was cleaned, dried, dressed sterilely using Dermabond and   Aquacel dressing.  The patient was then   brought to recovery room in stable condition, tolerating the procedure   well.   Please note that Physician Assistant, Costella Hatcher, PA-C was present for the entirety of the case, and was utilized for pre-operative positioning, peri-operative retractor management, general facilitation of the procedure and for primary wound closure at the end of the case.              Pietro Cassis Alvan Dame, M.D.    12/09/2021 10:27 AM

## 2021-12-09 NOTE — Progress Notes (Signed)
Assisted Dr. Andres Shad with Left Knee Adductor Canal block. Side rails up, monitors on throughout procedure. See vital signs in flow sheet. Tolerated Procedure well.

## 2021-12-09 NOTE — Anesthesia Procedure Notes (Signed)
Anesthesia Regional Block: Adductor canal block   Pre-Anesthetic Checklist: , timeout performed,  Correct Patient, Correct Site, Correct Laterality,  Correct Procedure, Correct Position, site marked,  Risks and benefits discussed,  Surgical consent,  Pre-op evaluation,  At surgeon's request and post-op pain management  Laterality: Lower and Left  Prep: chloraprep       Needles:  Injection technique: Single-shot  Needle Type: Echogenic Needle     Needle Length: 9cm  Needle Gauge: 22     Additional Needles:   Procedures:,,,, ultrasound used (permanent image in chart),,    Narrative:  Start time: 12/09/2021 8:18 AM End time: 12/09/2021 8:24 AM Injection made incrementally with aspirations every 5 mL.  Performed by: Personally  Anesthesiologist: Barnet Glasgow, MD  Additional Notes: Block assessed prior to surgery. Pt tolerated procedure well.

## 2021-12-09 NOTE — Transfer of Care (Signed)
Immediate Anesthesia Transfer of Care Note  Patient: Leonard Burke  Procedure(s) Performed: TOTAL KNEE ARTHROPLASTY (Left: Knee)  Patient Location: PACU  Anesthesia Type:Regional and Spinal  Level of Consciousness: sedated  Airway & Oxygen Therapy: Patient Spontanous Breathing and Patient connected to face mask oxygen  Post-op Assessment: Report given to RN and Post -op Vital signs reviewed and stable  Post vital signs: Reviewed and stable  Last Vitals:  Vitals Value Taken Time  BP    Temp    Pulse 70 12/09/21 1048  Resp 11 12/09/21 1048  SpO2 100 % 12/09/21 1048  Vitals shown include unvalidated device data.  Last Pain:  Vitals:   12/09/21 0705  TempSrc:   PainSc: 5       Patients Stated Pain Goal: 5 (35/59/74 1638)  Complications: No notable events documented.

## 2021-12-09 NOTE — Evaluation (Signed)
Physical Therapy Evaluation Patient Details Name: Leonard Burke MRN: [id number] DOB: 1951-03-15 Today's Date: 12/09/2021  History of Present Illness  Pt is a 71 year old male s/p Left TKA on 12/09/21  Clinical Impression  Pt is s/p TKA resulting in the deficits listed below (see PT Problem List).  Pt will benefit from skilled PT to increase their independence and safety with mobility to allow discharge to the venue listed below.  Pt assisted with ambulating in hallway POD #0.  Pt plans to return home upon d/c and has family that can stay a couple nights if needed upon d/c.       Recommendations for follow up therapy are one component of a multi-disciplinary discharge planning process, led by the attending physician.  Recommendations may be updated based on patient status, additional functional criteria and insurance authorization.  Follow Up Recommendations Follow physician's recommendations for discharge plan and follow up therapies (plans for OPPT)    Assistance Recommended at Discharge PRN  Patient can return home with the following  A little help with walking and/or transfers;A little help with bathing/dressing/bathroom;Help with stairs or ramp for entrance    Equipment Recommendations Rolling walker (2 wheels)  Recommendations for Other Services       Functional Status Assessment Patient has had a recent decline in their functional status and demonstrates the ability to make significant improvements in function in a reasonable and predictable amount of time.     Precautions / Restrictions Precautions Precautions: Knee;Fall Restrictions Weight Bearing Restrictions: No LLE Weight Bearing: Weight bearing as tolerated      Mobility  Bed Mobility Overal bed mobility: Needs Assistance Bed Mobility: Supine to Sit     Supine to sit: Min guard     General bed mobility comments: verbal cues for self assist    Transfers Overall transfer level: Needs assistance Equipment  used: Rolling walker (2 wheels) Transfers: Sit to/from Stand Sit to Stand: Min guard           General transfer comment: verbal cues for UE and LE positioning    Ambulation/Gait Ambulation/Gait assistance: Min guard Gait Distance (Feet): 100 Feet Assistive device: Rolling walker (2 wheels) Gait Pattern/deviations: Step-to pattern, Antalgic, Decreased stance time - left       General Gait Details: verbal cues for sequence, RW positioning, step length  Stairs            Wheelchair Mobility    Modified Rankin (Stroke Patients Only)       Balance                                             Pertinent Vitals/Pain Pain Assessment Pain Assessment: 0-10 Pain Score: 5  Pain Location: mostly posterior left knee Pain Descriptors / Indicators: Sore, Aching, Tightness Pain Intervention(s): Monitored during session, Repositioned    Home Living Family/patient expects to be discharged to:: Private residence Living Arrangements: Alone Available Help at Discharge: Family;Available 24 hours/day (family to stay with pt upon d/c) Type of Home: House Home Access: Stairs to enter Entrance Stairs-Rails: None Entrance Stairs-Number of Steps: 2-3   Home Layout: One level Home Equipment: None      Prior Function Prior Level of Function : Independent/Modified Independent                     Hand Dominance  Extremity/Trunk Assessment        Lower Extremity Assessment Lower Extremity Assessment: LLE deficits/detail LLE Deficits / Details: able to perform SLR and ankle pumps, observed approx 50* AROM with mobility       Communication   Communication: HOH  Cognition Arousal/Alertness: Awake/alert Behavior During Therapy: WFL for tasks assessed/performed Overall Cognitive Status: Within Functional Limits for tasks assessed                                          General Comments      Exercises Total Joint  Exercises Ankle Circles/Pumps: AROM, Both, 10 reps   Assessment/Plan    PT Assessment Patient needs continued PT services  PT Problem List Decreased strength;Decreased range of motion;Pain;Decreased mobility;Decreased knowledge of precautions;Decreased knowledge of use of DME       PT Treatment Interventions DME instruction;Therapeutic exercise;Gait training;Stair training;Functional mobility training;Therapeutic activities;Patient/family education    PT Goals (Current goals can be found in the Care Plan section)  Acute Rehab PT Goals PT Goal Formulation: With patient Time For Goal Achievement: 12/14/21 Potential to Achieve Goals: Good    Frequency 7X/week     Co-evaluation               AM-PAC PT "6 Clicks" Mobility  Outcome Measure Help needed turning from your back to your side while in a flat bed without using bedrails?: A Little Help needed moving from lying on your back to sitting on the side of a flat bed without using bedrails?: A Little Help needed moving to and from a bed to a chair (including a wheelchair)?: A Little Help needed standing up from a chair using your arms (e.g., wheelchair or bedside chair)?: A Little Help needed to walk in hospital room?: A Little Help needed climbing 3-5 steps with a railing? : A Little 6 Click Score: 18    End of Session Equipment Utilized During Treatment: Gait belt Activity Tolerance: Patient tolerated treatment well Patient left: in chair;with call bell/phone within reach;with chair alarm set Nurse Communication: Mobility status PT Visit Diagnosis: Other abnormalities of gait and mobility (R26.89)    Time: 2010-0712 PT Time Calculation (min) (ACUTE ONLY): 16 min   Charges:   PT Evaluation $PT Eval Low Complexity: 1 Low        Kati PT, DPT Acute Rehabilitation Services Pager: [phone] Office: [place] 12/09/2021, 5:58 PM

## 2021-12-09 NOTE — Anesthesia Procedure Notes (Addendum)
Spinal  Patient location during procedure: OR Start time: 12/09/2021 9:00 AM End time: 12/09/2021 9:10 AM Reason for block: surgical anesthesia Staffing Performed: anesthesiologist  Anesthesiologist: Barnet Glasgow, MD Preanesthetic Checklist Completed: patient identified, IV checked, risks and benefits discussed, surgical consent, monitors and equipment checked, pre-op evaluation and timeout performed Spinal Block Patient position: sitting Prep: DuraPrep and site prepped and draped Patient monitoring: heart rate, cardiac monitor, continuous pulse ox and blood pressure Approach: midline Location: L2-3 Injection technique: single-shot Needle Needle type: Pencan  Needle gauge: 24 G Needle length: 10 cm Needle insertion depth: 9 cm Assessment Sensory level: T4 Events: CSF return and second provider Additional Notes  1 attempt  at L4-5 Attempt  , 1 attempt at L3-4 unsucessful. 2nd Proviider 1 attempt at L2-3 succeful(s). Pt tolerated procedure well.

## 2021-12-09 NOTE — Anesthesia Postprocedure Evaluation (Signed)
Anesthesia Post Note  Patient: Leonard Burke  Procedure(s) Performed: TOTAL KNEE ARTHROPLASTY (Left: Knee)     Patient location during evaluation: Nursing Unit Anesthesia Type: Spinal Level of consciousness: oriented and awake and alert Pain management: pain level controlled Vital Signs Assessment: post-procedure vital signs reviewed and stable Respiratory status: spontaneous breathing and respiratory function stable Cardiovascular status: blood pressure returned to baseline and stable Postop Assessment: no headache, no backache, no apparent nausea or vomiting and patient able to bend at knees Anesthetic complications: no   No notable events documented.  Last Vitals:  Vitals:   12/09/21 1200 12/09/21 1301  BP: 119/69 118/70  Pulse: 61 62  Resp: 13 16  Temp:  36.7 C  SpO2: 95% 96%    Last Pain:  Vitals:   12/09/21 1301  TempSrc: Oral  PainSc: 4                  Barnet Glasgow

## 2021-12-09 NOTE — Discharge Instructions (Signed)

## 2021-12-10 ENCOUNTER — Encounter (HOSPITAL_COMMUNITY): Payer: Self-pay | Admitting: Orthopedic Surgery

## 2021-12-10 ENCOUNTER — Other Ambulatory Visit: Payer: Self-pay

## 2021-12-10 DIAGNOSIS — Z79899 Other long term (current) drug therapy: Secondary | ICD-10-CM | POA: Diagnosis not present

## 2021-12-10 DIAGNOSIS — I10 Essential (primary) hypertension: Secondary | ICD-10-CM | POA: Diagnosis not present

## 2021-12-10 DIAGNOSIS — M1712 Unilateral primary osteoarthritis, left knee: Secondary | ICD-10-CM | POA: Diagnosis not present

## 2021-12-10 DIAGNOSIS — M65162 Other infective (teno)synovitis, left knee: Secondary | ICD-10-CM | POA: Diagnosis not present

## 2021-12-10 DIAGNOSIS — Z87891 Personal history of nicotine dependence: Secondary | ICD-10-CM | POA: Diagnosis not present

## 2021-12-10 DIAGNOSIS — Z8546 Personal history of malignant neoplasm of prostate: Secondary | ICD-10-CM | POA: Diagnosis not present

## 2021-12-10 DIAGNOSIS — J45909 Unspecified asthma, uncomplicated: Secondary | ICD-10-CM | POA: Diagnosis not present

## 2021-12-10 LAB — CBC
HCT: 33.8 % — ABNORMAL LOW (ref 39.0–52.0)
Hemoglobin: 11.7 g/dL — ABNORMAL LOW (ref 13.0–17.0)
MCH: 31 pg (ref 26.0–34.0)
MCHC: 34.6 g/dL (ref 30.0–36.0)
MCV: 89.7 fL (ref 80.0–100.0)
Platelets: 149 10*3/uL — ABNORMAL LOW (ref 150–400)
RBC: 3.77 MIL/uL — ABNORMAL LOW (ref 4.22–5.81)
RDW: 13 % (ref 11.5–15.5)
WBC: 13.5 10*3/uL — ABNORMAL HIGH (ref 4.0–10.5)
nRBC: 0 % (ref 0.0–0.2)

## 2021-12-10 LAB — BASIC METABOLIC PANEL
Anion gap: 8 (ref 5–15)
BUN: 30 mg/dL — ABNORMAL HIGH (ref 8–23)
CO2: 23 mmol/L (ref 22–32)
Calcium: 9.1 mg/dL (ref 8.9–10.3)
Chloride: 103 mmol/L (ref 98–111)
Creatinine, Ser: 1.04 mg/dL (ref 0.61–1.24)
GFR, Estimated: >60 mL/min (ref >60)
Glucose, Bld: 140 mg/dL — ABNORMAL HIGH (ref 70–99)
Potassium: 4.4 mmol/L (ref 3.5–5.1)
Sodium: 134 mmol/L — ABNORMAL LOW (ref 135–145)

## 2021-12-10 MED ORDER — POLYETHYLENE GLYCOL 3350 17 G PO PACK: 17.0000 g | PACK | Freq: Every day | ORAL | 0 refills | Status: DC | PRN

## 2021-12-10 MED ORDER — ACETAMINOPHEN 325 MG PO TABS: 1000.0000 mg | ORAL_TABLET | Freq: Four times a day (QID) | ORAL | Status: DC

## 2021-12-10 MED ORDER — HYDROMORPHONE HCL 2 MG PO TABS: 2.0000 mg | ORAL_TABLET | ORAL | 0 refills | Status: DC | PRN

## 2021-12-10 MED ORDER — ASPIRIN 81 MG PO CHEW: 81.0000 mg | CHEWABLE_TABLET | Freq: Two times a day (BID) | ORAL | 0 refills | Status: AC

## 2021-12-10 MED ORDER — CELECOXIB 200 MG PO CAPS: 200.0000 mg | ORAL_CAPSULE | Freq: Two times a day (BID) | ORAL | 0 refills | Status: DC

## 2021-12-10 MED ORDER — DOCUSATE SODIUM 100 MG PO CAPS: 100.0000 mg | ORAL_CAPSULE | Freq: Two times a day (BID) | ORAL | 0 refills | Status: DC

## 2021-12-10 MED ORDER — ATENOLOL 50 MG PO TABS: 50.0000 mg | ORAL_TABLET | Freq: Every day | ORAL | 0 refills | Status: AC

## 2021-12-10 MED ORDER — METHOCARBAMOL 500 MG PO TABS: 500.0000 mg | ORAL_TABLET | Freq: Four times a day (QID) | ORAL | 0 refills | Status: DC | PRN

## 2021-12-10 NOTE — TOC Transition Note (Signed)
Transition of Care [facility]) - CM/SW Discharge Note   Patient Details  Name: Leonard Burke MRN: [id number] Date of Birth: December 08, 1950  [facility] CM/SW Contact:  Lennart Pall, LCSW Phone Number: 12/10/2021, 9:18 AM   Clinical Narrative:    Met briefly with pt and confirming he has all needed DME at home.  OPPT arranged at [facility]).  No TOC needs.   Final next level of care: OP Rehab Barriers to Discharge: No Barriers Identified   Patient Goals and CMS Choice Patient states their goals for this hospitalization and ongoing recovery are:: return home      Discharge Placement                       Discharge Plan and Services                DME Arranged: N/A DME Agency: NA                  Social Determinants of Health (SDOH) Interventions     Readmission Risk Interventions No flowsheet data found.

## 2021-12-10 NOTE — Progress Notes (Signed)
° °  Subjective: 1 Day Post-Op Procedure(s) (LRB): TOTAL KNEE ARTHROPLASTY (Left) Patient reports pain as mild.   Patient seen in rounds with Dr. Alvan Dame. Patient is well, and has had no acute complaints or problems. No acute events overnight. Foley catheter removed. Patient reports discomfort in the posterior knee.  We will start therapy today.   Objective: Vital signs in last 24 hours: Temp:  [98 F (36.7 C)-98.9 F (37.2 C)] 98.7 F (37.1 C) (02/03 0646) Pulse Rate:  [58-77] 67 (02/03 0646) Resp:  [11-17] 16 (02/03 0646) BP: (105-135)/(65-94) 135/73 (02/03 0646) SpO2:  [91 %-100 %] 97 % (02/03 0646)  Intake/Output from previous day:  Intake/Output Summary (Last 24 hours) at 12/10/2021 0724 Last data filed at 12/10/2021 0646 Gross per 24 hour  Intake 3265 ml  Output 1630 ml  Net 1635 ml     Intake/Output this shift: No intake/output data recorded.  Labs: Recent Labs    12/10/21 0318  HGB 11.7*   Recent Labs    12/10/21 0318  WBC 13.5*  RBC 3.77*  HCT 33.8*  PLT 149*   Recent Labs    12/10/21 0318  NA 134*  K 4.4  CL 103  CO2 23  BUN 30*  CREATININE 1.04  GLUCOSE 140*  CALCIUM 9.1   No results for input(s): LABPT, INR in the last 72 hours.  Exam: General - Patient is Alert and Oriented Extremity - Neurologically intact Sensation intact distally Intact pulses distally Dorsiflexion/Plantar flexion intact Dressing - dressing C/D/I Motor Function - intact, moving foot and toes well on exam.   Past Medical History:  Diagnosis Date   Arthritis    Asthma    HTN (hypertension)    Hypercholesteremia    Hypertriglyceridemia     Assessment/Plan: 1 Day Post-Op Procedure(s) (LRB): TOTAL KNEE ARTHROPLASTY (Left) Principal Problem:   S/P total knee arthroplasty, left  Estimated body mass index is 31.63 kg/m as calculated from the following:   Height as of this encounter: 5\' 9"  (1.753 m).   Weight as of this encounter: 97.2 kg. Advance diet Up with  therapy D/C IV fluids   Patient's anticipated LOS is less than 2 midnights, meeting these requirements: - Younger than 79 - Lives within 1 hour of care - Has a competent adult at home to recover with post-op recover - NO history of  - Chronic pain requiring opiods  - Diabetes  - Coronary Artery Disease  - Heart failure  - Heart attack  - Stroke  - DVT/VTE  - Cardiac arrhythmia  - Respiratory Failure/COPD  - Renal failure  - Anemia  - Advanced Liver disease     DVT Prophylaxis - Aspirin Weight bearing as tolerated.  Plan is to go Home after hospital stay. Plan for discharge today following 1-2 sessions of PT as long as they are meeting their goals. Patient is scheduled for OPPT. Follow up in the office in 2 weeks.   Griffith Citron, PA-C Orthopedic Surgery [phone] 12/10/2021, 7:24 AM

## 2021-12-10 NOTE — Plan of Care (Signed)
  Problem: Education: Goal: Knowledge of General Education information will improve Description: Including pain rating scale, medication(s)/side effects and non-pharmacologic comfort measures Outcome: Progressing   Problem: Activity: Goal: Risk for activity intolerance will decrease Outcome: Progressing   Problem: Pain Managment: Goal: General experience of comfort will improve Outcome: Progressing   

## 2021-12-10 NOTE — Progress Notes (Signed)
Physical Therapy Treatment Patient Details Name: Leonard Burke MRN: [id number] DOB: 1951-10-23 Today's Date: 12/10/2021   History of Present Illness Pt is a 71 year old male s/p Left TKA on 12/09/21    PT Comments    Pt ambulated in hallway, practiced safe stair technique and performed LE exercises. Pt provided with HEP handout and had no further questions.  Pt feels ready for d/c home today.   Recommendations for follow up therapy are one component of a multi-disciplinary discharge planning process, led by the attending physician.  Recommendations may be updated based on patient status, additional functional criteria and insurance authorization.  Follow Up Recommendations  Follow physician's recommendations for discharge plan and follow up therapies     Assistance Recommended at Discharge PRN  Patient can return home with the following A little help with walking and/or transfers;A little help with bathing/dressing/bathroom;Help with stairs or ramp for entrance   Equipment Recommendations  Rolling walker (2 wheels)    Recommendations for Other Services       Precautions / Restrictions Precautions Precautions: Knee;Fall Restrictions LLE Weight Bearing: Weight bearing as tolerated     Mobility  Bed Mobility Overal bed mobility: Needs Assistance Bed Mobility: Supine to Sit     Supine to sit: Supervision, HOB elevated          Transfers Overall transfer level: Needs assistance Equipment used: Rolling walker (2 wheels) Transfers: Sit to/from Stand Sit to Stand: Min guard, Supervision           General transfer comment: verbal cues for UE and LE positioning    Ambulation/Gait Ambulation/Gait assistance: Min guard, Supervision Gait Distance (Feet): 180 Feet Assistive device: Rolling walker (2 wheels) Gait Pattern/deviations: Step-to pattern, Antalgic, Decreased stance time - left, Step-through pattern       General Gait Details: verbal cues for sequence,  RW positioning, step length   Stairs Stairs: Yes Stairs assistance: Min guard Stair Management: Step to pattern, Forwards, One rail Left Number of Stairs: 2 General stair comments: verbal cues for safety and sequence; pt performed once and reports understanding   Wheelchair Mobility    Modified Rankin (Stroke Patients Only)       Balance                                            Cognition Arousal/Alertness: Awake/alert Behavior During Therapy: WFL for tasks assessed/performed Overall Cognitive Status: Within Functional Limits for tasks assessed                                          Exercises Total Joint Exercises Ankle Circles/Pumps: AROM, 10 reps, Both Quad Sets: AROM, Both, 10 reps Heel Slides: AAROM, Left, 10 reps, Supine Hip ABduction/ADduction: AROM, Left, 10 reps Straight Leg Raises: AROM, Left, 10 reps Long Arc Quad: AROM, Left, 10 reps Knee Flexion: AROM, Seated, Left, 10 reps    General Comments        Pertinent Vitals/Pain Pain Assessment Pain Assessment: 0-10 Pain Score: 6  Pain Location: mostly posterior left knee Pain Descriptors / Indicators: Sore, Aching, Tightness Pain Intervention(s): Repositioned, Monitored during session, RN gave pain meds during session    Home Living  Prior Function            PT Goals (current goals can now be found in the care plan section) Progress towards PT goals: Progressing toward goals    Frequency    7X/week      PT Plan Current plan remains appropriate    Co-evaluation              AM-PAC PT "6 Clicks" Mobility   Outcome Measure  Help needed turning from your back to your side while in a flat bed without using bedrails?: A Little Help needed moving from lying on your back to sitting on the side of a flat bed without using bedrails?: A Little Help needed moving to and from a bed to a chair (including a wheelchair)?:  A Little Help needed standing up from a chair using your arms (e.g., wheelchair or bedside chair)?: A Little Help needed to walk in hospital room?: A Little Help needed climbing 3-5 steps with a railing? : A Little 6 Click Score: 18    End of Session Equipment Utilized During Treatment: Gait belt Activity Tolerance: Patient tolerated treatment well Patient left: in chair;with call bell/phone within reach;with chair alarm set Nurse Communication: Mobility status PT Visit Diagnosis: Other abnormalities of gait and mobility (R26.89)     Time: 9150-4136 PT Time Calculation (min) (ACUTE ONLY): 20 min  Charges:  $Gait Training: 8-22 mins                    Arlyce Dice, DPT Acute Rehabilitation Services Pager: [phone] Office: [place] 12/10/2021, 3:23 PM

## 2021-12-15 DIAGNOSIS — M25562 Pain in left knee: Secondary | ICD-10-CM | POA: Diagnosis not present

## 2021-12-22 DIAGNOSIS — M25562 Pain in left knee: Secondary | ICD-10-CM | POA: Diagnosis not present

## 2021-12-23 NOTE — Discharge Summary (Signed)
Patient ID: Leonard Burke MRN: [id number] DOB/AGE: 01-18-1951 71 y.o.  Admit date: 12/09/2021 Discharge date: 12/10/2021  Admission Diagnoses:  Left knee osteoarthritis  Discharge Diagnoses:  Principal Problem:   S/P total knee arthroplasty, left   Past Medical History:  Diagnosis Date   Arthritis    Asthma    HTN (hypertension)    Hypercholesteremia    Hypertriglyceridemia     Surgeries: Procedure(s): TOTAL KNEE ARTHROPLASTY on 12/09/2021   Consultants:   Discharged Condition: Improved  Hospital Course: Leonard Burke is an 71 y.o. male who was admitted 12/09/2021 for operative treatment ofS/P total knee arthroplasty, left. Patient has severe unremitting pain that affects sleep, daily activities, and work/hobbies. After pre-op clearance the patient was taken to the operating room on 12/09/2021 and underwent  Procedure(s): TOTAL KNEE ARTHROPLASTY.    Patient was given perioperative antibiotics:  Anti-infectives (From admission, onward)    Start     Dose/Rate Route Frequency Ordered Stop   12/09/21 1500  ceFAZolin (ANCEF) IVPB 2g/100 mL premix        2 g 200 mL/hr over 30 Minutes Intravenous Every 6 hours 12/09/21 1300 12/09/21 2106   12/09/21 0630  ceFAZolin (ANCEF) IVPB 2g/100 mL premix        2 g 200 mL/hr over 30 Minutes Intravenous On call to O.R. 12/09/21 2703 12/09/21 0912        Patient was given sequential compression devices, early ambulation, and chemoprophylaxis to prevent DVT. Patient worked with PT and was meeting their goals regarding safe ambulation and transfers.  Patient benefited maximally from hospital stay and there were no complications.    Recent vital signs: No data found.   Recent laboratory studies: No results for input(s): WBC, HGB, HCT, PLT, NA, K, CL, CO2, BUN, CREATININE, GLUCOSE, INR, CALCIUM in the last 72 hours.  Invalid input(s): PT, 2   Discharge Medications:   Allergies as of 12/10/2021       Reactions   Codeine Nausea And  Vomiting   Penicillins Rash   Tolerated Cephalosporin 12/09/21   Ivp Dye [iodinated Contrast Media] Hives        Medication List     STOP taking these medications    diclofenac 75 MG EC tablet Commonly known as: VOLTAREN       TAKE these medications    acetaminophen 325 MG tablet Commonly known as: TYLENOL Take 3 tablets (975 mg total) by mouth every 6 (six) hours.   amLODipine 10 MG tablet Commonly known as: NORVASC Take 10 mg by mouth daily.   aspirin 81 MG chewable tablet Chew 1 tablet (81 mg total) by mouth 2 (two) times daily for 28 days.   atenolol 50 MG tablet Commonly known as: TENORMIN Take 1 tablet (50 mg total) by mouth daily.   celecoxib 200 MG capsule Commonly known as: CELEBREX Take 1 capsule (200 mg total) by mouth 2 (two) times daily.   docusate sodium 100 MG capsule Commonly known as: COLACE Take 1 capsule (100 mg total) by mouth 2 (two) times daily.   Eye Drops 0.012-0.2 % Soln Generic drug: Naphazoline-Polyethyl Glycol Place 1 drop into both eyes daily as needed (Allergies/dry eyes).   fenofibrate 145 MG tablet Commonly known as: TRICOR Take 1 tablet (145 mg total) by mouth daily.   HYDROmorphone 2 MG tablet Commonly known as: DILAUDID Take 1-2 tablets (2-4 mg total) by mouth every 4 (four) hours as needed for severe pain.   methocarbamol 500 MG tablet Commonly known  as: ROBAXIN Take 1 tablet (500 mg total) by mouth every 6 (six) hours as needed for muscle spasms.   olmesartan 20 MG tablet Commonly known as: BENICAR Take 1 tablet (20 mg total) by mouth daily.   polyethylene glycol 17 g packet Commonly known as: MIRALAX / GLYCOLAX Take 17 g by mouth daily as needed for mild constipation.   rosuvastatin 5 MG tablet Commonly known as: CRESTOR Take 5 mg by mouth daily.   Ventolin HFA 108 (90 Base) MCG/ACT inhaler Generic drug: albuterol Inhale 90 mcg into the lungs every 4 (four) hours as needed for wheezing or shortness of  breath.   zolpidem 10 MG tablet Commonly known as: AMBIEN Take 10 mg by mouth daily as needed for sleep.               Discharge Care Instructions  (From admission, onward)           Start     Ordered   12/10/21 0000  Change dressing       Comments: Maintain surgical dressing until follow up in the clinic. If the edges start to pull up, may reinforce with tape. If the dressing is no longer working, may remove and cover with gauze and tape, but must keep the area dry and clean.  Call with any questions or concerns.   12/10/21 0727            Diagnostic Studies: No results found.  Disposition: Discharge disposition: 01-Home or Self Care       Discharge Instructions     Call MD / Call 911   Complete by: As directed    If you experience chest pain or shortness of breath, CALL 911 and be transported to the hospital emergency room.  If you develope a fever above 101 F, pus (white drainage) or increased drainage or redness at the wound, or calf pain, call your surgeon's office.   Change dressing   Complete by: As directed    Maintain surgical dressing until follow up in the clinic. If the edges start to pull up, may reinforce with tape. If the dressing is no longer working, may remove and cover with gauze and tape, but must keep the area dry and clean.  Call with any questions or concerns.   Constipation Prevention   Complete by: As directed    Drink plenty of fluids.  Prune juice may be helpful.  You may use a stool softener, such as Colace (over the counter) 100 mg twice a day.  Use MiraLax (over the counter) for constipation as needed.   Diet - low sodium heart healthy   Complete by: As directed    Increase activity slowly as tolerated   Complete by: As directed    Weight bearing as tolerated with assist device (walker, cane, etc) as directed, use it as long as suggested by your surgeon or therapist, typically at least 4-6 weeks.   Post-operative opioid taper  instructions:   Complete by: As directed    POST-OPERATIVE OPIOID TAPER INSTRUCTIONS: It is important to wean off of your opioid medication as soon as possible. If you do not need pain medication after your surgery it is ok to stop day one. Opioids include: Codeine, Hydrocodone(Norco, Vicodin), Oxycodone(Percocet, oxycontin) and hydromorphone amongst others.  Long term and even short term use of opiods can cause: Increased pain response Dependence Constipation Depression Respiratory depression And more.  Withdrawal symptoms can include Flu like symptoms Nausea, vomiting And more Techniques  to manage these symptoms Hydrate well Eat regular healthy meals Stay active Use relaxation techniques(deep breathing, meditating, yoga) Do Not substitute Alcohol to help with tapering If you have been on opioids for less than two weeks and do not have pain than it is ok to stop all together.  Plan to wean off of opioids This plan should start within one week post op of your joint replacement. Maintain the same interval or time between taking each dose and first decrease the dose.  Cut the total daily intake of opioids by one tablet each day Next start to increase the time between doses. The last dose that should be eliminated is the evening dose.      TED hose   Complete by: As directed    Use stockings (TED hose) for 2 weeks on both leg(s).  You may remove them at night for sleeping.        Follow-up Information     Paralee Cancel, MD. Schedule an appointment as soon as possible for a visit in 2 week(s).   Specialty: Orthopedic Surgery Contact information: [address] [place] [address] [phone]                  Signed: Irving Copas 12/23/2021, 9:56 AM

## 2021-12-24 DIAGNOSIS — M25562 Pain in left knee: Secondary | ICD-10-CM | POA: Diagnosis not present

## 2021-12-29 DIAGNOSIS — M25562 Pain in left knee: Secondary | ICD-10-CM | POA: Diagnosis not present

## 2021-12-31 DIAGNOSIS — M25562 Pain in left knee: Secondary | ICD-10-CM | POA: Diagnosis not present

## 2022-01-04 DIAGNOSIS — M25562 Pain in left knee: Secondary | ICD-10-CM | POA: Diagnosis not present

## 2022-01-06 DIAGNOSIS — M25562 Pain in left knee: Secondary | ICD-10-CM | POA: Diagnosis not present

## 2022-01-07 DIAGNOSIS — C61 Malignant neoplasm of prostate: Secondary | ICD-10-CM | POA: Diagnosis not present

## 2022-01-10 DIAGNOSIS — M25562 Pain in left knee: Secondary | ICD-10-CM | POA: Diagnosis not present

## 2022-01-13 DIAGNOSIS — M25562 Pain in left knee: Secondary | ICD-10-CM | POA: Diagnosis not present

## 2022-01-14 DIAGNOSIS — C61 Malignant neoplasm of prostate: Secondary | ICD-10-CM | POA: Diagnosis not present

## 2022-01-14 DIAGNOSIS — N400 Enlarged prostate without lower urinary tract symptoms: Secondary | ICD-10-CM | POA: Diagnosis not present

## 2022-01-17 DIAGNOSIS — M25562 Pain in left knee: Secondary | ICD-10-CM | POA: Diagnosis not present

## 2022-01-18 ENCOUNTER — Other Ambulatory Visit: Payer: Self-pay | Admitting: Urology

## 2022-01-18 DIAGNOSIS — C61 Malignant neoplasm of prostate: Secondary | ICD-10-CM

## 2022-01-20 DIAGNOSIS — M25562 Pain in left knee: Secondary | ICD-10-CM | POA: Diagnosis not present

## 2022-01-21 DIAGNOSIS — Z471 Aftercare following joint replacement surgery: Secondary | ICD-10-CM | POA: Diagnosis not present

## 2022-01-21 DIAGNOSIS — Z96652 Presence of left artificial knee joint: Secondary | ICD-10-CM | POA: Diagnosis not present

## 2022-01-21 DIAGNOSIS — M1711 Unilateral primary osteoarthritis, right knee: Secondary | ICD-10-CM | POA: Diagnosis not present

## 2022-01-24 ENCOUNTER — Encounter (HOSPITAL_COMMUNITY): Payer: Self-pay | Admitting: Orthopedic Surgery

## 2022-01-24 ENCOUNTER — Encounter (HOSPITAL_COMMUNITY)
Admission: RE | Admit: 2022-01-24 | Discharge: 2022-01-24 | Disposition: A | Discharge status: Home / Self Care | Payer: Medicare Other | Source: Ambulatory Visit | Attending: Orthopedic Surgery | Admitting: Orthopedic Surgery

## 2022-01-24 ENCOUNTER — Other Ambulatory Visit: Payer: Self-pay

## 2022-01-24 ENCOUNTER — Other Ambulatory Visit (HOSPITAL_COMMUNITY): Payer: Self-pay | Admitting: *Deleted

## 2022-01-24 DIAGNOSIS — M25562 Pain in left knee: Secondary | ICD-10-CM | POA: Diagnosis not present

## 2022-01-24 NOTE — Patient Instructions (Addendum)
DUE TO COVID-19 ONLY ONE VISITOR  (aged 70 and older)  IS ALLOWED TO COME WITH YOU AND STAY IN THE WAITING ROOM ONLY DURING PRE OP AND PROCEDURE.   ?**NO VISITORS ARE ALLOWED IN THE SHORT STAY AREA OR RECOVERY ROOM!!** ? ?IF YOU WILL BE ADMITTED INTO THE HOSPITAL YOU ARE ALLOWED ONLY TWO SUPPORT PEOPLE DURING VISITATION HOURS ONLY (7 AM -8PM)   ?The support person(s) must pass our screening, gel in and out, and wear a mask at all times, including in the patient?s room. ?Patients must also wear a mask when staff or their support person are in the room. ?Visitors GUEST BADGE MUST BE WORN VISIBLY  ?One adult visitor may remain with you overnight and MUST be in the room by 8 P.M. ?  ? ? Your procedure is scheduled on: 02/03/22 ? ? Report to [facility] Main Entrance ? ?  Report to admitting at : 6:00 AM ? ? Call this number if you have problems the morning of surgery [phone] ? ? Do not eat food :After Midnight. ? ? After Midnight you may have the following liquids until : 5:30 AM DAY OF SURGERY ? ?Water ?Black Coffee (sugar ok, NO MILK/CREAM OR CREAMERS)  ?Tea (sugar ok, NO MILK/CREAM OR CREAMERS) regular and decaf                             ?Plain Jell-O (NO RED)                                           ?Fruit ices (not with fruit pulp, NO RED)                                     ?Popsicles (NO RED)                                                                  ?Juice: apple, WHITE grape, WHITE cranberry ?Sports drinks like Gatorade (NO RED) ?Clear broth(vegetable,chicken,beef) ? ?             ?Drink  Ensure drink  AT: 5:30 AM the morning of surgery.   ?  ?The day of surgery:  ?Drink ONE (1) Pre-Surgery Clear Ensure or G2 at AM the morning of surgery. Drink in one sitting. Do not sip.  ?This drink was given to you during your hospital  ?pre-op appointment visit. ?Nothing else to drink after completing the  ?Pre-Surgery Clear Ensure or G2. ?  ?       If you have questions, please contact your surgeon?s  office. ? ? ?FOLLOW BOWEL PREP AND ANY ADDITIONAL PRE OP INSTRUCTIONS YOU RECEIVED FROM YOUR SURGEON'S OFFICE!!! ?  ?  ?Oral Hygiene is also important to reduce your risk of infection.                                    ?Remember - BRUSH YOUR TEETH THE MORNING OF SURGERY  WITH YOUR REGULAR TOOTHPASTE ? ? Do NOT smoke after Midnight ? ? Take these medicines the morning of surgery with A SIP OF WATER: Atenolol,amlodipine.Use inhalers as usual. ? ?DO NOT TAKE ANY ORAL DIABETIC MEDICATIONS DAY OF YOUR SURGERY ? ?Bring CPAP mask and tubing day of surgery. ?                  ?           You may not have any metal on your body including hair pins, jewelry, and body piercing ? ?           Do not wear  lotions, powders, perfumes/cologne, or deodorant ? ?            Men may shave face and neck. ? ? Do not bring valuables to the hospital. [facility] NOT ?            RESPONSIBLE   FOR VALUABLES. ? ? Contacts, dentures or bridgework may not be worn into surgery. ? ? Bring small overnight bag day of surgery. ?  ? Patients discharged on the day of surgery will not be allowed to drive home.  Someone NEEDS to stay with you for the first 24 hours after anesthesia. ? ? Special Instructions: Bring a copy of your healthcare power of attorney and living will documents         the day of surgery if you haven't scanned them before. ? ?            Please read over the following fact sheets you were given: [facility] [place] [phone] ? ?   [facility] - Preparing for Surgery ?Before surgery, you can play an important role.  Because skin is not sterile, your skin needs to be as free of germs as possible.  You can reduce the number of germs on your skin by washing with CHG (chlorahexidine gluconate) soap before surgery.  CHG is an antiseptic cleaner which kills germs and bonds with the skin to continue killing germs even after washing. ?Please DO NOT use if you have an allergy to CHG or  antibacterial soaps.  If your skin becomes reddened/irritated stop using the CHG and inform your nurse when you arrive at Short Stay. ?Do not shave (including legs and underarms) for at least 48 hours prior to the first CHG shower.  You may shave your face/neck. ?Please follow these instructions carefully: ? 1.  Shower with CHG Soap the night before surgery and the  morning of Surgery. ? 2.  If you choose to wash your hair, wash your hair first as usual with your  normal  shampoo. ? 3.  After you shampoo, rinse your hair and body thoroughly to remove the  shampoo.                           4.  Use CHG as you would any other liquid soap.  You can apply chg directly  to the skin and wash  ?                     Gently with a scrungie or clean washcloth. ? 5.  Apply the CHG Soap to your body ONLY FROM THE NECK DOWN.   Do not use on face/ open      ?  Wound or open sores. Avoid contact with eyes, ears mouth and genitals (private parts).  ?                     Production manager,  Genitals (private parts) with your normal soap. ?            6.  Wash thoroughly, paying special attention to the area where your surgery  will be performed. ? 7.  Thoroughly rinse your body with warm water from the neck down. ? 8.  DO NOT shower/wash with your normal soap after using and rinsing off  the CHG Soap. ?               9.  Pat yourself dry with a clean towel. ?           10.  Wear clean pajamas. ?           11.  Place clean sheets on your bed the night of your first shower and do not  sleep with pets. ?Day of Surgery : ?Do not apply any lotions/deodorants the morning of surgery.  Please wear clean clothes to the hospital/surgery center. ? ?FAILURE TO FOLLOW THESE INSTRUCTIONS MAY RESULT IN THE CANCELLATION OF YOUR SURGERY ?PATIENT SIGNATURE_________________________________ ? ?NURSE SIGNATURE__________________________________ ? ?________________________________________________________________________  ? ?Incentive  Spirometer ? ?An incentive spirometer is a tool that can help keep your lungs clear and active. This tool measures how well you are filling your lungs with each breath. Taking long deep breaths may help reverse or decrease the chance of developing breathing (pulmonary) problems (especially infection) following: ?A long period of time when you are unable to move or be active. ?BEFORE THE PROCEDURE  ?If the spirometer includes an indicator to show your best effort, your nurse or respiratory therapist will set it to a desired goal. ?If possible, sit up straight or lean slightly forward. Try not to slouch. ?Hold the incentive spirometer in an upright position. ?INSTRUCTIONS FOR USE  ?Sit on the edge of your bed if possible, or sit up as far as you can in bed or on a chair. ?Hold the incentive spirometer in an upright position. ?Breathe out normally. ?Place the mouthpiece in your mouth and seal your lips tightly around it. ?Breathe in slowly and as deeply as possible, raising the piston or the ball toward the top of the column. ?Hold your breath for 3-5 seconds or for as long as possible. Allow the piston or ball to fall to the bottom of the column. ?Remove the mouthpiece from your mouth and breathe out normally. ?Rest for a few seconds and repeat Steps 1 through 7 at least 10 times every 1-2 hours when you are awake. Take your time and take a few normal breaths between deep breaths. ?The spirometer may include an indicator to show your best effort. Use the indicator as a goal to work toward during each repetition. ?After each set of 10 deep breaths, practice coughing to be sure your lungs are clear. If you have an incision (the cut made at the time of surgery), support your incision when coughing by placing a pillow or rolled up towels firmly against it. ?Once you are able to get out of bed, walk around indoors and cough well. You may stop using the incentive spirometer when instructed by your caregiver.  ?RISKS AND  COMPLICATIONS ?Take your time so you do not get dizzy or light-headed. ?If you are in pain, you may need to take or  ask for pain medication before doing incentive spirometry. It is harder to take a deep breath if you

## 2022-01-24 NOTE — Progress Notes (Signed)
For Short Stay: ?[place] appointment date: N/A ?Date of COVID positive in last 90 days: N/A ?COVID Vaccine: Pfizer x 3: 2022. ?Bowel Prep reminder: ? ? ?For Anesthesia: ?PCP - Dr. Orpah Melter ?Cardiologist -  ? ?Chest x-ray -  ?EKG - 11/29/21 ?Stress Test -  ?ECHO -  ?Cardiac Cath -  ?Pacemaker/ICD device last checked: ?Pacemaker orders received: ?Device Rep notified: ? ?Spinal Cord Stimulator: ? ?Sleep Study -  ?CPAP -  ? ?Fasting Blood Sugar -  ?Checks Blood Sugar _____ times a day ?Date and result of last Hgb A1c- ? ?Blood Thinner Instructions: ?Aspirin Instructions: ?Last Dose: ? ?Activity level: Can go up a flight of stairs and activities of daily living without stopping and without chest pain and/or shortness of breath ?  Able to exercise without chest pain and/or shortness of breath ?  Unable to go up a flight of stairs without chest pain and/or shortness of breath ?   ? ?Anesthesia review:  ? ?Patient denies shortness of breath, fever, cough and chest pain at PAT appointment ? ? ?Patient verbalized understanding of instructions that were given to them at the PAT appointment. Patient was also instructed that they will need to review over the PAT instructions again at home before surgery.  ?

## 2022-01-25 ENCOUNTER — Encounter (HOSPITAL_COMMUNITY)
Admission: RE | Admit: 2022-01-25 | Discharge: 2022-01-25 | Disposition: A | Discharge status: Home / Self Care | Payer: Medicare Other | Source: Ambulatory Visit | Attending: Orthopedic Surgery | Admitting: Orthopedic Surgery

## 2022-01-25 DIAGNOSIS — Z01812 Encounter for preprocedural laboratory examination: Secondary | ICD-10-CM | POA: Insufficient documentation

## 2022-01-25 DIAGNOSIS — M1711 Unilateral primary osteoarthritis, right knee: Secondary | ICD-10-CM | POA: Insufficient documentation

## 2022-01-25 DIAGNOSIS — Z01818 Encounter for other preprocedural examination: Secondary | ICD-10-CM

## 2022-01-25 LAB — COMPREHENSIVE METABOLIC PANEL
ALT: 19 U/L (ref 0–44)
AST: 21 U/L (ref 15–41)
Albumin: 4.4 g/dL (ref 3.5–5.0)
Alkaline Phosphatase: 60 U/L (ref 38–126)
Anion gap: 9 (ref 5–15)
BUN: 23 mg/dL (ref 8–23)
CO2: 24 mmol/L (ref 22–32)
Calcium: 9.7 mg/dL (ref 8.9–10.3)
Chloride: 102 mmol/L (ref 98–111)
Creatinine, Ser: 0.93 mg/dL (ref 0.61–1.24)
GFR, Estimated: >60 mL/min (ref >60)
Glucose, Bld: 159 mg/dL — ABNORMAL HIGH (ref 70–99)
Potassium: 3.8 mmol/L (ref 3.5–5.1)
Sodium: 135 mmol/L (ref 135–145)
Total Bilirubin: 0.4 mg/dL (ref 0.3–1.2)
Total Protein: 7.1 g/dL (ref 6.5–8.1)

## 2022-01-25 LAB — CBC
HCT: 40.1 % (ref 39.0–52.0)
Hemoglobin: 13.6 g/dL (ref 13.0–17.0)
MCH: 30.7 pg (ref 26.0–34.0)
MCHC: 33.9 g/dL (ref 30.0–36.0)
MCV: 90.5 fL (ref 80.0–100.0)
Platelets: 176 10*3/uL (ref 150–400)
RBC: 4.43 MIL/uL (ref 4.22–5.81)
RDW: 13.3 % (ref 11.5–15.5)
WBC: 6.3 10*3/uL (ref 4.0–10.5)
nRBC: 0 % (ref 0.0–0.2)

## 2022-01-25 LAB — SURGICAL PCR SCREEN
MRSA, PCR: NEGATIVE
Staphylococcus aureus: NEGATIVE

## 2022-01-27 DIAGNOSIS — M25562 Pain in left knee: Secondary | ICD-10-CM | POA: Diagnosis not present

## 2022-01-27 NOTE — Progress Notes (Signed)
CBC is a medically necessary test prior to undergoing joint replacement surgery. It is pertinent to know pre-operative hemoglobin for surgical risk assessment and planning.  ?

## 2022-01-31 DIAGNOSIS — M25562 Pain in left knee: Secondary | ICD-10-CM | POA: Diagnosis not present

## 2022-02-02 NOTE — H&P (Signed)
TOTAL KNEE ADMISSION H&P ? ?Patient is being admitted for right total knee arthroplasty. ? ?Subjective: ? ?Chief Complaint:right knee pain. ? ?HPI: Leonard Burke, 71 y.o. male, has a history of pain and functional disability in the right knee due to arthritis and has failed non-surgical conservative treatments for greater than 12 weeks to includeNSAID's and/or analgesics and activity modification.  Onset of symptoms was gradual, starting 2 years ago with gradually worsening course since that time. The patient noted no past surgery on the right knee(s).  Patient currently rates pain in the right knee(s) at 8 out of 10 with activity. Patient has worsening of pain with activity and weight bearing, pain that interferes with activities of daily living, and pain with passive range of motion.  Patient has evidence of joint space narrowing by imaging studies. There is no active infection. ? ?Patient Active Problem List  ? Diagnosis Date Noted  ? S/P total knee arthroplasty, left 12/09/2021  ? Malignant neoplasm of prostate ([place]) 06/23/2021  ? HTN (hypertension)   ? Hypertriglyceridemia   ? ?Past Medical History:  ?Diagnosis Date  ? Arthritis   ? Asthma   ? HTN (hypertension)   ? Hypercholesteremia   ? Hypertriglyceridemia   ?  ?Past Surgical History:  ?Procedure Laterality Date  ? COLONOSCOPY    ? CYST EXCISION    ? KNEE ARTHROSCOPY    ? SHOULDER ARTHROSCOPY Bilateral   ? TOTAL KNEE ARTHROPLASTY Left 12/09/2021  ? Procedure: TOTAL KNEE ARTHROPLASTY;  Surgeon: Paralee Cancel, MD;  Location: [facility];  Service: Orthopedics;  Laterality: Left;  ?  ?No current facility-administered medications for this encounter.  ? ?Current Outpatient Medications  ?Medication Sig Dispense Refill Last Dose  ? amLODipine (NORVASC) 10 MG tablet Take 10 mg by mouth daily.   11/29/2021  ? fenofibrate (TRICOR) 145 MG tablet Take 1 tablet (145 mg total) by mouth daily. 90 tablet 3 11/29/2021  ? Naphazoline-Polyethyl Glycol (EYE DROPS) 0.012-0.2 % SOLN  Place 1 drop into both eyes daily as needed (Allergies/dry eyes).   Past Month  ? olmesartan (BENICAR) 20 MG tablet Take 1 tablet (20 mg total) by mouth daily. 30 tablet 11 11/29/2021  ? rosuvastatin (CRESTOR) 5 MG tablet Take 5 mg by mouth daily.   11/29/2021  ? VENTOLIN HFA 108 (90 Base) MCG/ACT inhaler Inhale 90 mcg into the lungs every 4 (four) hours as needed for wheezing or shortness of breath.  1 Past Month  ? zolpidem (AMBIEN) 10 MG tablet Take 10 mg by mouth daily as needed for sleep.   more than a month  ? acetaminophen (TYLENOL) 325 MG tablet Take 3 tablets (975 mg total) by mouth every 6 (six) hours.     ? atenolol (TENORMIN) 50 MG tablet Take 1 tablet (50 mg total) by mouth daily. 30 tablet 0   ? celecoxib (CELEBREX) 200 MG capsule Take 1 capsule (200 mg total) by mouth 2 (two) times daily. 60 capsule 0   ? docusate sodium (COLACE) 100 MG capsule Take 1 capsule (100 mg total) by mouth 2 (two) times daily. 10 capsule 0   ? HYDROmorphone (DILAUDID) 2 MG tablet Take 1-2 tablets (2-4 mg total) by mouth every 4 (four) hours as needed for severe pain. 42 tablet 0   ? methocarbamol (ROBAXIN) 500 MG tablet Take 1 tablet (500 mg total) by mouth every 6 (six) hours as needed for muscle spasms. 40 tablet 0   ? polyethylene glycol (MIRALAX / GLYCOLAX) 17 g packet Take  17 g by mouth daily as needed for mild constipation. 14 each 0   ? ?Allergies  ?Allergen Reactions  ? Codeine Nausea And Vomiting  ? Penicillins Rash  ?  Tolerated Cephalosporin 12/09/21 ? ?  ? Ivp Dye [Iodinated Contrast Media] Hives  ?  ?Social History  ? ?Tobacco Use  ? Smoking status: Former  ?  Types: Cigarettes  ? Smokeless tobacco: Never  ?Substance Use Topics  ? Alcohol use: Not Currently  ?  Comment: Occasional  ?  ?Family History  ?Problem Relation Age of Onset  ? Other Neg Hx   ?     hypertriglyceridemia  ?  ? ?Review of Systems  ?Constitutional:  Negative for chills and fever.  ?Respiratory:  Negative for cough and shortness of breath.    ?Cardiovascular:  Negative for chest pain.  ?Gastrointestinal:  Negative for nausea and vomiting.  ?Musculoskeletal:  Positive for arthralgias.  ? ? ?Objective: ? ?Physical Exam ?Well nourished and well developed. ?General: Alert and oriented x3, cooperative and pleasant, no acute distress. ?Head: normocephalic, atraumatic, neck supple. ?Eyes: EOMI. ? ?Musculoskeletal: ?Left Knee. Well healed arthroplasty scar. No erythema. AROM 5-120. ? ?Right Knee: ?Significant bilateral genu varum associated with 5 degree flexion contracture ?Tenderness over the medial and anterior aspect of the knee ?No palpable effusion, warmth or erythema ?No lower extremity edema or erythema ? ? ?Calves soft and nontender. Motor function intact in LE. Strength 5/5 LE bilaterally. ?Neuro: Distal pulses 2+. Sensation to light touch intact in LE. ? ?Vital signs in last 24 hours: ?  ? ?Labs: ? ? ?Estimated body mass index is 31.57 kg/m? as calculated from the following: ?  Height as of 01/25/22: '5\' 10"'$  (1.778 m). ?  Weight as of 01/25/22: 99.8 kg. ? ? ?Imaging Review ?Plain radiographs demonstrate severe degenerative joint disease of the right knee(s). The overall alignment isneutral. The bone quality appears to be adequate for age and reported activity level. ? ? ? ? ? ?Assessment/Plan: ? ?End stage arthritis, right knee  ? ?The patient history, physical examination, clinical judgment of the provider and imaging studies are consistent with end stage degenerative joint disease of the right knee(s) and total knee arthroplasty is deemed medically necessary. The treatment options including medical management, injection therapy arthroscopy and arthroplasty were discussed at length. The risks and benefits of total knee arthroplasty were presented and reviewed. The risks due to aseptic loosening, infection, stiffness, patella tracking problems, thromboembolic complications and other imponderables were discussed. The patient acknowledged the explanation,  agreed to proceed with the plan and consent was signed. Patient is being admitted for inpatient treatment for surgery, pain control, PT, OT, prophylactic antibiotics, VTE prophylaxis, progressive ambulation and ADL's and discharge planning. The patient is planning to be discharged  home. ? ?Therapy Plans: outpatient [facility] at [facility] ?Disposition: Home with grandson ?Planned DVT Prophylaxis: aspirin '81mg'$  BID ?DME needed: none ?PCP: Dr. Orpah Melter, clearance received ?TXA: IV ?Allergies: PCN - childhood rxn, Contrast - hives ?Anesthesia Concerns: none ?BMI: 32.2 ?Last HgbA1c: Not diabetic ? ?Other: ?- hydromorphone, celebrex, robaxin, tylenol ?- IN & OUT CATHETER ONLY** - trouble with this last time ?- Slow progress with extension on the left ? ? ?Patient's anticipated LOS is less than 2 midnights, meeting these requirements: ?- Younger than 52 ?- Lives within 1 hour of care ?- Has a competent adult at home to recover with post-op recover ?- NO history of ? - Chronic pain requiring opiods ? - Diabetes ? -  Coronary Artery Disease ? - Heart failure ? - Heart attack ? - Stroke ? - DVT/VTE ? - Cardiac arrhythmia ? - Respiratory Failure/COPD ? - Renal failure ? - Anemia ? - Advanced Liver disease ? ?Costella Hatcher, PA-C ?Orthopedic Surgery ?[facility] [place] ?[phone] ? ? ?

## 2022-02-03 ENCOUNTER — Other Ambulatory Visit: Payer: Self-pay

## 2022-02-03 ENCOUNTER — Observation Stay (HOSPITAL_COMMUNITY)
Admission: RE | Admit: 2022-02-03 | Discharge: 2022-02-04 | Disposition: A | Discharge status: Home / Self Care | Payer: Medicare Other | Source: Ambulatory Visit | Attending: Orthopedic Surgery | Admitting: Orthopedic Surgery

## 2022-02-03 ENCOUNTER — Encounter (HOSPITAL_COMMUNITY)
Admission: RE | Disposition: A | Discharge status: Home / Self Care | Payer: Self-pay | Source: Ambulatory Visit | Attending: Orthopedic Surgery

## 2022-02-03 ENCOUNTER — Encounter (HOSPITAL_COMMUNITY): Payer: Self-pay | Admitting: Orthopedic Surgery

## 2022-02-03 ENCOUNTER — Ambulatory Visit (HOSPITAL_BASED_OUTPATIENT_CLINIC_OR_DEPARTMENT_OTHER): Payer: Medicare Other | Admitting: Certified Registered"

## 2022-02-03 ENCOUNTER — Ambulatory Visit (HOSPITAL_COMMUNITY): Payer: Medicare Other | Admitting: Certified Registered"

## 2022-02-03 DIAGNOSIS — Z96652 Presence of left artificial knee joint: Secondary | ICD-10-CM | POA: Diagnosis not present

## 2022-02-03 DIAGNOSIS — M1711 Unilateral primary osteoarthritis, right knee: Secondary | ICD-10-CM

## 2022-02-03 DIAGNOSIS — G8918 Other acute postprocedural pain: Secondary | ICD-10-CM | POA: Diagnosis not present

## 2022-02-03 DIAGNOSIS — Z8546 Personal history of malignant neoplasm of prostate: Secondary | ICD-10-CM | POA: Diagnosis not present

## 2022-02-03 DIAGNOSIS — M659 Synovitis and tenosynovitis, unspecified: Secondary | ICD-10-CM | POA: Diagnosis not present

## 2022-02-03 DIAGNOSIS — Z87891 Personal history of nicotine dependence: Secondary | ICD-10-CM | POA: Insufficient documentation

## 2022-02-03 DIAGNOSIS — Z96651 Presence of right artificial knee joint: Secondary | ICD-10-CM

## 2022-02-03 DIAGNOSIS — M25761 Osteophyte, right knee: Secondary | ICD-10-CM | POA: Insufficient documentation

## 2022-02-03 DIAGNOSIS — M25461 Effusion, right knee: Secondary | ICD-10-CM | POA: Insufficient documentation

## 2022-02-03 DIAGNOSIS — I10 Essential (primary) hypertension: Secondary | ICD-10-CM | POA: Insufficient documentation

## 2022-02-03 DIAGNOSIS — J45909 Unspecified asthma, uncomplicated: Secondary | ICD-10-CM | POA: Insufficient documentation

## 2022-02-03 DIAGNOSIS — Z01818 Encounter for other preprocedural examination: Secondary | ICD-10-CM

## 2022-02-03 DIAGNOSIS — Z79899 Other long term (current) drug therapy: Secondary | ICD-10-CM | POA: Diagnosis not present

## 2022-02-03 HISTORY — PX: TOTAL KNEE ARTHROPLASTY: SHX125

## 2022-02-03 LAB — TYPE AND SCREEN
ABO/RH(D): O NEG
Antibody Screen: NEGATIVE

## 2022-02-03 SURGERY — ARTHROPLASTY, KNEE, TOTAL
Anesthesia: Spinal | Site: Knee | Laterality: Right

## 2022-02-03 MED ORDER — PROPOFOL 1000 MG/100ML IV EMUL
INTRAVENOUS | Status: AC
  Filled 2022-02-03: qty 100

## 2022-02-03 MED ORDER — SODIUM CHLORIDE 0.9 % IV SOLN: INTRAVENOUS | Status: DC

## 2022-02-03 MED ORDER — HYDROMORPHONE HCL 1 MG/ML IJ SOLN
INTRAMUSCULAR | Status: AC
  Filled 2022-02-03: qty 1

## 2022-02-03 MED ORDER — HYDROMORPHONE HCL 1 MG/ML IJ SOLN: 0.5000 mg | INTRAMUSCULAR | Status: DC | PRN

## 2022-02-03 MED ORDER — EPHEDRINE SULFATE-NACL 50-0.9 MG/10ML-% IV SOSY
PREFILLED_SYRINGE | INTRAVENOUS | Status: DC | PRN
  Administered 2022-02-03 (×2): 10 mg via INTRAVENOUS

## 2022-02-03 MED ORDER — DEXAMETHASONE SODIUM PHOSPHATE 10 MG/ML IJ SOLN
8.0000 mg | Freq: Once | INTRAMUSCULAR | Status: AC
  Administered 2022-02-03: 8 mg via INTRAVENOUS

## 2022-02-03 MED ORDER — BUPIVACAINE-EPINEPHRINE (PF) 0.25% -1:200000 IJ SOLN
INTRAMUSCULAR | Status: AC
  Filled 2022-02-03: qty 30

## 2022-02-03 MED ORDER — TRANEXAMIC ACID-NACL 1000-0.7 MG/100ML-% IV SOLN
1000.0000 mg | INTRAVENOUS | Status: AC
  Administered 2022-02-03: 1000 mg via INTRAVENOUS
  Filled 2022-02-03: qty 100

## 2022-02-03 MED ORDER — ORAL CARE MOUTH RINSE: 15.0000 mL | Freq: Once | OROMUCOSAL | Status: AC

## 2022-02-03 MED ORDER — BUPIVACAINE IN DEXTROSE 0.75-8.25 % IT SOLN
INTRATHECAL | Status: DC | PRN
  Administered 2022-02-03: 1.6 mL via INTRATHECAL

## 2022-02-03 MED ORDER — BUPIVACAINE-EPINEPHRINE (PF) 0.25% -1:200000 IJ SOLN
INTRAMUSCULAR | Status: DC | PRN
  Administered 2022-02-03: 30 mL

## 2022-02-03 MED ORDER — PROPOFOL 10 MG/ML IV BOLUS
INTRAVENOUS | Status: DC | PRN
  Administered 2022-02-03 (×2): 20 mg via INTRAVENOUS

## 2022-02-03 MED ORDER — ROPIVACAINE HCL 5 MG/ML IJ SOLN
INTRAMUSCULAR | Status: DC | PRN
  Administered 2022-02-03: 20 mL via PERINEURAL

## 2022-02-03 MED ORDER — ONDANSETRON HCL 4 MG/2ML IJ SOLN
INTRAMUSCULAR | Status: AC
  Filled 2022-02-03: qty 2

## 2022-02-03 MED ORDER — PHENOL 1.4 % MT LIQD: 1.0000 | OROMUCOSAL | Status: DC | PRN

## 2022-02-03 MED ORDER — BISACODYL 10 MG RE SUPP: 10.0000 mg | Freq: Every day | RECTAL | Status: DC | PRN

## 2022-02-03 MED ORDER — PROPOFOL 10 MG/ML IV BOLUS
INTRAVENOUS | Status: AC
  Filled 2022-02-03: qty 20

## 2022-02-03 MED ORDER — LACTATED RINGERS IV SOLN: INTRAVENOUS | Status: DC

## 2022-02-03 MED ORDER — ALBUTEROL SULFATE HFA 108 (90 BASE) MCG/ACT IN AERS: 1.0000 | INHALATION_SPRAY | RESPIRATORY_TRACT | Status: DC | PRN

## 2022-02-03 MED ORDER — CELECOXIB 200 MG PO CAPS
200.0000 mg | ORAL_CAPSULE | Freq: Two times a day (BID) | ORAL | Status: DC
  Administered 2022-02-03 (×2): 200 mg via ORAL
  Filled 2022-02-03 (×2): qty 1

## 2022-02-03 MED ORDER — ACETAMINOPHEN 10 MG/ML IV SOLN: 1000.0000 mg | Freq: Once | INTRAVENOUS | Status: DC | PRN

## 2022-02-03 MED ORDER — ATENOLOL 50 MG PO TABS
50.0000 mg | ORAL_TABLET | Freq: Every day | ORAL | Status: DC
  Administered 2022-02-04: 50 mg via ORAL
  Filled 2022-02-03: qty 1

## 2022-02-03 MED ORDER — VANCOMYCIN HCL 1000 MG IV SOLR
INTRAVENOUS | Status: AC
  Filled 2022-02-03: qty 20

## 2022-02-03 MED ORDER — KETOROLAC TROMETHAMINE 30 MG/ML IJ SOLN
INTRAMUSCULAR | Status: AC
  Filled 2022-02-03: qty 1

## 2022-02-03 MED ORDER — OXYCODONE HCL 5 MG/5ML PO SOLN: 5.0000 mg | Freq: Once | ORAL | Status: DC | PRN

## 2022-02-03 MED ORDER — ASPIRIN 81 MG PO CHEW
81.0000 mg | CHEWABLE_TABLET | Freq: Two times a day (BID) | ORAL | Status: DC
  Administered 2022-02-03 – 2022-02-04 (×2): 81 mg via ORAL
  Filled 2022-02-03 (×2): qty 1

## 2022-02-03 MED ORDER — METHOCARBAMOL 500 MG PO TABS: 500.0000 mg | ORAL_TABLET | Freq: Four times a day (QID) | ORAL | Status: DC | PRN

## 2022-02-03 MED ORDER — ONDANSETRON HCL 4 MG/2ML IJ SOLN: 4.0000 mg | Freq: Four times a day (QID) | INTRAMUSCULAR | Status: DC | PRN

## 2022-02-03 MED ORDER — PHENYLEPHRINE HCL-NACL 20-0.9 MG/250ML-% IV SOLN
INTRAVENOUS | Status: DC | PRN
  Administered 2022-02-03: 30 ug/min via INTRAVENOUS

## 2022-02-03 MED ORDER — ZOLPIDEM TARTRATE 5 MG PO TABS: 5.0000 mg | ORAL_TABLET | Freq: Every day | ORAL | Status: DC | PRN

## 2022-02-03 MED ORDER — EPHEDRINE 5 MG/ML INJ
INTRAVENOUS | Status: AC
  Filled 2022-02-03: qty 5

## 2022-02-03 MED ORDER — MENTHOL 3 MG MT LOZG: 1.0000 | LOZENGE | OROMUCOSAL | Status: DC | PRN

## 2022-02-03 MED ORDER — DEXAMETHASONE SODIUM PHOSPHATE 10 MG/ML IJ SOLN
INTRAMUSCULAR | Status: AC
  Filled 2022-02-03: qty 1

## 2022-02-03 MED ORDER — METOCLOPRAMIDE HCL 5 MG PO TABS: 5.0000 mg | ORAL_TABLET | Freq: Three times a day (TID) | ORAL | Status: DC | PRN

## 2022-02-03 MED ORDER — DOCUSATE SODIUM 100 MG PO CAPS
100.0000 mg | ORAL_CAPSULE | Freq: Two times a day (BID) | ORAL | Status: DC
  Administered 2022-02-03 – 2022-02-04 (×3): 100 mg via ORAL
  Filled 2022-02-03 (×3): qty 1

## 2022-02-03 MED ORDER — OXYCODONE HCL 5 MG PO TABS: 5.0000 mg | ORAL_TABLET | Freq: Once | ORAL | Status: DC | PRN

## 2022-02-03 MED ORDER — ONDANSETRON HCL 4 MG PO TABS: 4.0000 mg | ORAL_TABLET | Freq: Four times a day (QID) | ORAL | Status: DC | PRN

## 2022-02-03 MED ORDER — ROSUVASTATIN CALCIUM 5 MG PO TABS
5.0000 mg | ORAL_TABLET | Freq: Every day | ORAL | Status: DC
  Administered 2022-02-04: 5 mg via ORAL
  Filled 2022-02-03: qty 1

## 2022-02-03 MED ORDER — ONDANSETRON HCL 4 MG/2ML IJ SOLN
INTRAMUSCULAR | Status: DC | PRN
  Administered 2022-02-03: 4 mg via INTRAVENOUS

## 2022-02-03 MED ORDER — PROPOFOL 500 MG/50ML IV EMUL
INTRAVENOUS | Status: AC
  Filled 2022-02-03: qty 50

## 2022-02-03 MED ORDER — CEFAZOLIN SODIUM-DEXTROSE 2-4 GM/100ML-% IV SOLN
2.0000 g | INTRAVENOUS | Status: AC
  Administered 2022-02-03: 2 g via INTRAVENOUS
  Filled 2022-02-03: qty 100

## 2022-02-03 MED ORDER — ONDANSETRON HCL 4 MG/2ML IJ SOLN: 4.0000 mg | Freq: Once | INTRAMUSCULAR | Status: DC | PRN

## 2022-02-03 MED ORDER — PHENYLEPHRINE HCL (PRESSORS) 10 MG/ML IV SOLN
INTRAVENOUS | Status: AC
  Filled 2022-02-03: qty 1

## 2022-02-03 MED ORDER — DIPHENHYDRAMINE HCL 12.5 MG/5ML PO ELIX: 12.5000 mg | ORAL_SOLUTION | ORAL | Status: DC | PRN

## 2022-02-03 MED ORDER — CHLORHEXIDINE GLUCONATE 0.12 % MT SOLN
15.0000 mL | Freq: Once | OROMUCOSAL | Status: AC
  Administered 2022-02-03: 15 mL via OROMUCOSAL

## 2022-02-03 MED ORDER — IRBESARTAN 150 MG PO TABS
150.0000 mg | ORAL_TABLET | Freq: Every day | ORAL | Status: DC
  Administered 2022-02-04: 150 mg via ORAL
  Filled 2022-02-03: qty 1

## 2022-02-03 MED ORDER — TRANEXAMIC ACID-NACL 1000-0.7 MG/100ML-% IV SOLN
1000.0000 mg | Freq: Once | INTRAVENOUS | Status: AC
  Administered 2022-02-03: 1000 mg via INTRAVENOUS
  Filled 2022-02-03: qty 100

## 2022-02-03 MED ORDER — SODIUM CHLORIDE (PF) 0.9 % IJ SOLN
INTRAMUSCULAR | Status: AC
  Filled 2022-02-03: qty 30

## 2022-02-03 MED ORDER — METHOCARBAMOL 500 MG IVPB - SIMPLE MED
500.0000 mg | Freq: Four times a day (QID) | INTRAVENOUS | Status: DC | PRN
  Administered 2022-02-03: 500 mg via INTRAVENOUS
  Filled 2022-02-03: qty 50

## 2022-02-03 MED ORDER — POLYETHYLENE GLYCOL 3350 17 G PO PACK: 17.0000 g | PACK | Freq: Every day | ORAL | Status: DC | PRN

## 2022-02-03 MED ORDER — MIDAZOLAM HCL 2 MG/2ML IJ SOLN
1.0000 mg | Freq: Once | INTRAMUSCULAR | Status: AC
  Administered 2022-02-03: 2 mg via INTRAVENOUS
  Filled 2022-02-03: qty 2

## 2022-02-03 MED ORDER — SODIUM CHLORIDE (PF) 0.9 % IJ SOLN
INTRAMUSCULAR | Status: DC | PRN
  Administered 2022-02-03: 30 mL

## 2022-02-03 MED ORDER — CEFAZOLIN SODIUM-DEXTROSE 2-4 GM/100ML-% IV SOLN
2.0000 g | Freq: Four times a day (QID) | INTRAVENOUS | Status: AC
  Administered 2022-02-03 (×2): 2 g via INTRAVENOUS
  Filled 2022-02-03 (×2): qty 100

## 2022-02-03 MED ORDER — METOCLOPRAMIDE HCL 5 MG/ML IJ SOLN: 5.0000 mg | Freq: Three times a day (TID) | INTRAMUSCULAR | Status: DC | PRN

## 2022-02-03 MED ORDER — ACETAMINOPHEN 325 MG PO TABS: 325.0000 mg | ORAL_TABLET | Freq: Four times a day (QID) | ORAL | Status: DC | PRN

## 2022-02-03 MED ORDER — FENOFIBRATE 54 MG PO TABS
54.0000 mg | ORAL_TABLET | Freq: Every day | ORAL | Status: DC
  Administered 2022-02-04: 54 mg via ORAL
  Filled 2022-02-03: qty 1

## 2022-02-03 MED ORDER — STERILE WATER FOR IRRIGATION IR SOLN
Status: DC | PRN
  Administered 2022-02-03: 2000 mL

## 2022-02-03 MED ORDER — HYDROMORPHONE HCL 1 MG/ML IJ SOLN
0.2500 mg | INTRAMUSCULAR | Status: DC | PRN
  Administered 2022-02-03 (×3): 0.5 mg via INTRAVENOUS

## 2022-02-03 MED ORDER — POVIDONE-IODINE 10 % EX SWAB
2.0000 "application " | Freq: Once | CUTANEOUS | Status: AC
  Administered 2022-02-03: 2 via TOPICAL

## 2022-02-03 MED ORDER — HYDROMORPHONE HCL 2 MG PO TABS
2.0000 mg | ORAL_TABLET | ORAL | Status: DC | PRN
  Administered 2022-02-03 – 2022-02-04 (×4): 4 mg via ORAL
  Filled 2022-02-03 (×4): qty 2

## 2022-02-03 MED ORDER — TOBRAMYCIN SULFATE 1.2 G IJ SOLR
INTRAMUSCULAR | Status: AC
  Filled 2022-02-03: qty 1.2

## 2022-02-03 MED ORDER — DEXAMETHASONE SODIUM PHOSPHATE 10 MG/ML IJ SOLN
10.0000 mg | Freq: Once | INTRAMUSCULAR | Status: AC
  Administered 2022-02-04: 10 mg via INTRAVENOUS
  Filled 2022-02-03: qty 1

## 2022-02-03 MED ORDER — PROPOFOL 500 MG/50ML IV EMUL
INTRAVENOUS | Status: DC | PRN
  Administered 2022-02-03: 110 ug/kg/min via INTRAVENOUS

## 2022-02-03 MED ORDER — METHOCARBAMOL 500 MG IVPB - SIMPLE MED
INTRAVENOUS | Status: AC
  Filled 2022-02-03: qty 50

## 2022-02-03 MED ORDER — 0.9 % SODIUM CHLORIDE (POUR BTL) OPTIME
TOPICAL | Status: DC | PRN
  Administered 2022-02-03: 1000 mL

## 2022-02-03 MED ORDER — FENTANYL CITRATE PF 50 MCG/ML IJ SOSY
50.0000 ug | PREFILLED_SYRINGE | Freq: Once | INTRAMUSCULAR | Status: AC
  Administered 2022-02-03: 50 ug via INTRAVENOUS
  Filled 2022-02-03: qty 2

## 2022-02-03 MED ORDER — KETOROLAC TROMETHAMINE 30 MG/ML IJ SOLN
INTRAMUSCULAR | Status: DC | PRN
  Administered 2022-02-03: 30 mg

## 2022-02-03 MED ORDER — AMLODIPINE BESYLATE 10 MG PO TABS
10.0000 mg | ORAL_TABLET | Freq: Every day | ORAL | Status: DC
  Administered 2022-02-04: 10 mg via ORAL
  Filled 2022-02-03: qty 1

## 2022-02-03 MED ORDER — FERROUS SULFATE 325 (65 FE) MG PO TABS
325.0000 mg | ORAL_TABLET | Freq: Three times a day (TID) | ORAL | Status: DC
  Administered 2022-02-03 – 2022-02-04 (×2): 325 mg via ORAL
  Filled 2022-02-03 (×2): qty 1

## 2022-02-03 SURGICAL SUPPLY — 56 items
ATTUNE MED ANAT PAT 38 KNEE (Knees) ×1 IMPLANT
ATTUNE PS FEM RT SZ 7 CEM KNEE (Femur) ×1 IMPLANT
ATTUNE PSRP INSR SZ7 8 KNEE (Insert) ×1 IMPLANT
BAG COUNTER SPONGE SURGICOUNT (BAG) IMPLANT
BAG SPNG CNTER NS LX DISP (BAG)
BAG ZIPLOCK 12X15 (MISCELLANEOUS) IMPLANT
BASE TIBIAL ROT PLAT SZ 8 KNEE (Knees) IMPLANT
BLADE SAW SGTL 11.0X1.19X90.0M (BLADE) IMPLANT
BLADE SAW SGTL 13.0X1.19X90.0M (BLADE) ×2 IMPLANT
BLADE SURG SZ10 CARB STEEL (BLADE) ×4 IMPLANT
BNDG ELASTIC 6X5.8 VLCR STR LF (GAUZE/BANDAGES/DRESSINGS) ×2 IMPLANT
BOWL SMART MIX CTS (DISPOSABLE) ×2 IMPLANT
BSPLAT TIB 8 CMNT ROT PLAT STR (Knees) ×1 IMPLANT
CEMENT HV SMART SET (Cement) ×2 IMPLANT
CUFF TOURN SGL QUICK 34 (TOURNIQUET CUFF) ×2
CUFF TRNQT CYL 34X4.125X (TOURNIQUET CUFF) ×1 IMPLANT
DERMABOND ADVANCED (GAUZE/BANDAGES/DRESSINGS) ×1
DERMABOND ADVANCED .7 DNX12 (GAUZE/BANDAGES/DRESSINGS) ×1 IMPLANT
DRAPE INCISE IOBAN 66X45 STRL (DRAPES) ×2 IMPLANT
DRAPE U-SHAPE 47X51 STRL (DRAPES) ×1 IMPLANT
DRESSING AQUACEL AG SP 3.5X10 (GAUZE/BANDAGES/DRESSINGS) ×1 IMPLANT
DRSG AQUACEL AG ADV 3.5X10 (GAUZE/BANDAGES/DRESSINGS) ×1 IMPLANT
DRSG AQUACEL AG SP 3.5X10 (GAUZE/BANDAGES/DRESSINGS) ×2
DURAPREP 26ML APPLICATOR (WOUND CARE) ×4 IMPLANT
ELECT REM PT RETURN 15FT ADLT (MISCELLANEOUS) ×2 IMPLANT
GLOVE SURG ENC MOIS LTX SZ6 (GLOVE) ×2 IMPLANT
GLOVE SURG ENC MOIS LTX SZ7 (GLOVE) ×2 IMPLANT
GLOVE SURG UNDER LTX SZ6.5 (GLOVE) ×2 IMPLANT
GLOVE SURG UNDER POLY LF SZ7.5 (GLOVE) ×2 IMPLANT
GOWN STRL REUS W/ TWL LRG LVL3 (GOWN DISPOSABLE) ×1 IMPLANT
GOWN STRL REUS W/TWL LRG LVL3 (GOWN DISPOSABLE) ×4
HANDPIECE INTERPULSE COAX TIP (DISPOSABLE) ×2
HOLDER FOLEY CATH W/STRAP (MISCELLANEOUS) IMPLANT
IMMOBILIZER KNEE 22 UNIV (SOFTGOODS) ×1 IMPLANT
KIT TURNOVER KIT A (KITS) IMPLANT
MANIFOLD NEPTUNE II (INSTRUMENTS) ×2 IMPLANT
NDL SAFETY ECLIPSE 18X1.5 (NEEDLE) IMPLANT
NEEDLE HYPO 18GX1.5 SHARP (NEEDLE)
NS IRRIG 1000ML POUR BTL (IV SOLUTION) ×2 IMPLANT
PACK TOTAL KNEE CUSTOM (KITS) ×2 IMPLANT
PROTECTOR NERVE ULNAR (MISCELLANEOUS) ×2 IMPLANT
SET HNDPC FAN SPRY TIP SCT (DISPOSABLE) ×1 IMPLANT
SET PAD KNEE POSITIONER (MISCELLANEOUS) ×2 IMPLANT
SPIKE FLUID TRANSFER (MISCELLANEOUS) ×5 IMPLANT
SUT MNCRL AB 4-0 PS2 18 (SUTURE) ×2 IMPLANT
SUT STRATAFIX PDS+ 0 24IN (SUTURE) ×2 IMPLANT
SUT VIC AB 1 CT1 36 (SUTURE) ×2 IMPLANT
SUT VIC AB 2-0 CT1 27 (SUTURE) ×4
SUT VIC AB 2-0 CT1 TAPERPNT 27 (SUTURE) ×2 IMPLANT
SYR 3ML LL SCALE MARK (SYRINGE) ×2 IMPLANT
TIBIAL BASE ROT PLAT SZ 8 KNEE (Knees) ×2 IMPLANT
TRAY CATH INTERMITTENT SS 16FR (CATHETERS) ×1 IMPLANT
TRAY FOLEY MTR SLVR 16FR STAT (SET/KITS/TRAYS/PACK) ×1 IMPLANT
TUBE SUCTION HIGH CAP CLEAR NV (SUCTIONS) ×2 IMPLANT
WATER STERILE IRR 1000ML POUR (IV SOLUTION) ×4 IMPLANT
WRAP KNEE MAXI GEL POST OP (GAUZE/BANDAGES/DRESSINGS) ×2 IMPLANT

## 2022-02-03 NOTE — Anesthesia Procedure Notes (Signed)
Anesthesia Regional Block: Adductor canal block  ? ?Pre-Anesthetic Checklist: , timeout performed,  Correct Patient, Correct Site, Correct Laterality,  Correct Procedure, Correct Position, site marked,  Risks and benefits discussed,  Surgical consent,  Pre-op evaluation,  At surgeon's request and post-op pain management ? ?Laterality: Right ? ?Prep: chloraprep     ?  ?Needles:  ?Injection technique: Single-shot ? ?Needle Type: Echogenic Needle   ? ? ?Needle Length: 9cm  ? ? ? ? ?Additional Needles: ? ? ?Procedures:,,,, ultrasound used (permanent image in chart),,    ?Narrative:  ?Start time: 02/03/2022 8:24 AM ?End time: 02/03/2022 8:29 AM ?Injection made incrementally with aspirations every 5 mL. ? ?Performed by: Personally  ?Anesthesiologist: Myrtie Soman, MD ? ?Additional Notes: ?Patient tolerated the procedure well without complications ? ? ? ?

## 2022-02-03 NOTE — Anesthesia Procedure Notes (Signed)
Anesthesia Procedure Image    

## 2022-02-03 NOTE — Progress Notes (Signed)
Assisted Dr. Rose with right, adductor canal, ultrasound guided block. Side rails up, monitors on throughout procedure. See vital signs in flow sheet. Tolerated Procedure well. 

## 2022-02-03 NOTE — Anesthesia Preprocedure Evaluation (Signed)
Anesthesia Evaluation  ?Patient identified by MRN, date of birth, ID band ?Patient awake ? ? ? ?Reviewed: ?Allergy & Precautions, NPO status , Patient's Chart, lab work & pertinent test results ? ?Airway ?Mallampati: II ? ?TM Distance: >3 FB ?Neck ROM: Full ? ? ? Dental ?no notable dental hx. ? ?  ?Pulmonary ?asthma , former smoker,  ?  ?Pulmonary exam normal ?breath sounds clear to auscultation ? ? ? ? ? ? Cardiovascular ?hypertension, Normal cardiovascular exam ?Rhythm:Regular Rate:Normal ? ? ?  ?Neuro/Psych ?negative neurological ROS ? negative psych ROS  ? GI/Hepatic ?negative GI ROS, Neg liver ROS,   ?Endo/Other  ?negative endocrine ROS ? Renal/GU ?negative Renal ROS  ?negative genitourinary ?  ?Musculoskeletal ?negative musculoskeletal ROS ?(+)  ? Abdominal ?  ?Peds ?negative pediatric ROS ?(+)  Hematology ?negative hematology ROS ?(+)   ?Anesthesia Other Findings ? ? Reproductive/Obstetrics ?negative OB ROS ? ?  ? ? ? ? ? ? ? ? ? ? ? ? ? ?  ?  ? ? ? ? ? ? ? ? ?Anesthesia Physical ?Anesthesia Plan ? ?ASA: 2 ? ?Anesthesia Plan: Spinal  ? ?Post-op Pain Management: Regional block* and Dilaudid IV  ? ?Induction: Intravenous ? ?PONV Risk Score and Plan: 2 and Ondansetron, Propofol infusion and Treatment may vary due to age or medical condition ? ?Airway Management Planned: Simple Face Mask ? ?Additional Equipment:  ? ?Intra-op Plan:  ? ?Post-operative Plan:  ? ?Informed Consent: I have reviewed the patients History and Physical, chart, labs and discussed the procedure including the risks, benefits and alternatives for the proposed anesthesia with the patient or authorized representative who has indicated his/her understanding and acceptance.  ? ? ? ?Dental advisory given ? ?Plan Discussed with: CRNA and Surgeon ? ?Anesthesia Plan Comments:   ? ? ? ? ? ? ?Anesthesia Quick Evaluation ? ?

## 2022-02-03 NOTE — Anesthesia Postprocedure Evaluation (Signed)
Anesthesia Post Note ? ?Patient: [name] ? ?Procedure(Burke) Performed: TOTAL KNEE ARTHROPLASTY (Right: Knee) ? ?  ? ?Patient location during evaluation: PACU ?Anesthesia Type: Spinal ?Level of consciousness: oriented and awake and alert ?Pain management: pain level controlled ?Vital Signs Assessment: post-procedure vital signs reviewed and stable ?Respiratory status: spontaneous breathing, respiratory function stable and patient connected to nasal cannula oxygen ?Cardiovascular status: blood pressure returned to baseline and stable ?Postop Assessment: no headache, no backache and no apparent nausea or vomiting ?Anesthetic complications: no ? ? ?No notable events documented. ? ?Last Vitals:  ?Vitals:  ? 02/03/22 1230 02/03/22 1249  ?BP: 103/74 (!) 117/98  ?Pulse: 64 62  ?Resp: 13 16  ?Temp:  36.4 ?C  ?SpO2: 100% 95%  ?  ?Last Pain:  ?Vitals:  ? 02/03/22 1249  ?TempSrc: Oral  ?PainSc: 0-No pain  ? ? ?  ?  ?  ?  ?  ?  ? ?Leonard Burke ? ? ? ? ?

## 2022-02-03 NOTE — Evaluation (Signed)
Physical Therapy Evaluation ?Patient Details ?Name: Leonard Burke ?MRN: [id number] ?DOB: Sep 20, 1951 ?Today's Date: 02/03/2022 ? ?History of Present Illness ? 71 y.o. male admitted 02/03/22 for R TKA. PMH includes L TKA 12/09/21, prostate cancer.  ?Clinical Impression ? Pt is s/p TKA resulting in the deficits listed below (see PT Problem List). Pt ambulated 120' with RW, no loss of balance. Good progress expected.  Pt will benefit from skilled PT to increase their independence and safety with mobility to allow discharge to the venue listed below.  ?   ?   ? ?Recommendations for follow up therapy are one component of a multi-disciplinary discharge planning process, led by the attending physician.  Recommendations may be updated based on patient status, additional functional criteria and insurance authorization. ? ?Follow Up Recommendations Follow physician's recommendations for discharge plan and follow up therapies ? ?  ?Assistance Recommended at Discharge Intermittent Supervision/Assistance  ?Patient can return home with the following ? A little help with walking and/or transfers;A little help with bathing/dressing/bathroom;Help with stairs or ramp for entrance;Assist for transportation ? ?  ?Equipment Recommendations None recommended by PT  ?Recommendations for Other Services ?    ?  ?Functional Status Assessment Patient has had a recent decline in their functional status and demonstrates the ability to make significant improvements in function in a reasonable and predictable amount of time.  ? ?  ?Precautions / Restrictions Precautions ?Precautions: Fall;Knee ?Precaution Comments: reviewed no pillow under knee ?Restrictions ?Weight Bearing Restrictions: Yes ?RLE Weight Bearing: Weight bearing as tolerated ?LLE Weight Bearing: Weight bearing as tolerated ?Other Position/Activity Restrictions: WBAT RLE  ? ?  ? ?Mobility ? Bed Mobility ?Overal bed mobility: Modified Independent ?  ?  ?  ?Supine to sit: Modified  independent (Device/Increase time) ?  ?  ?General bed mobility comments: HOB up, used rail ?  ? ?Transfers ?Overall transfer level: Needs assistance ?Equipment used: Rolling walker (2 wheels) ?Transfers: Sit to/from Stand ?Sit to Stand: Min guard, From elevated surface ?  ?  ?  ?  ?  ?General transfer comment: verbal cues for UE and LE positioning ?  ? ?Ambulation/Gait ?Ambulation/Gait assistance: Min guard ?Gait Distance (Feet): 120 Feet ?Assistive device: Rolling walker (2 wheels) ?Gait Pattern/deviations: Step-to pattern, Antalgic, Decreased stance time - left, Step-through pattern ?  ?  ?  ?General Gait Details: verbal cues for sequence, RW positioning, step length ? ?Stairs ?  ?  ?  ?  ?  ? ?Wheelchair Mobility ?  ? ?Modified Rankin (Stroke Patients Only) ?  ? ?  ? ?Balance Overall balance assessment: Modified Independent ?  ?  ?  ?  ?  ?  ?  ?  ?  ?  ?  ?  ?  ?  ?  ?  ?  ?  ?   ? ? ? ?Pertinent Vitals/Pain Pain Assessment ?Pain Score: 5  ?Pain Location: R knee ?Pain Descriptors / Indicators: Sore, Aching, Tightness ?Pain Intervention(s): Limited activity within patient's tolerance, Monitored during session, Premedicated before session, Ice applied  ? ? ?Home Living Family/patient expects to be discharged to:: Private residence ?Living Arrangements: Alone ?Available Help at Discharge: Family;Available 24 hours/day (family to stay with pt upon d/c) ?Type of Home: House ?Home Access: Level entry ?Entrance Stairs-Rails: None ?Entrance Stairs-Number of Steps: 2-3 ?  ?Home Layout: One level ?Home Equipment: Conservation officer, nature (2 wheels) ?Additional Comments: 51 y.o. grandson to assist  ?  ?Prior Function Prior Level of Function : Independent/Modified Independent ?  ?  ?  ?  ?  ?  ?  Mobility Comments: walked without AD ?ADLs Comments: independent ?  ? ? ?Hand Dominance  ?   ? ?  ?Extremity/Trunk Assessment  ? Upper Extremity Assessment ?Upper Extremity Assessment: Overall WFL for tasks assessed ?  ? ?Lower Extremity  Assessment ?Lower Extremity Assessment: RLE deficits/detail ?RLE Deficits / Details: 3/5 SLR, 3/5 knee ext, 5-80* AAROM knee ?RLE Sensation: WNL ?  ? ?Cervical / Trunk Assessment ?Cervical / Trunk Assessment: Normal  ?Communication  ? Communication: HOH  ?Cognition Arousal/Alertness: Awake/alert ?Behavior During Therapy: [facility] for tasks assessed/performed ?Overall Cognitive Status: Within Functional Limits for tasks assessed ?  ?  ?  ?  ?  ?  ?  ?  ?  ?  ?  ?  ?  ?  ?  ?  ?  ?  ?  ? ?  ?General Comments   ? ?  ?Exercises    ? ?Assessment/Plan  ?  ?PT Assessment Patient needs continued PT services  ?PT Problem List Decreased strength;Decreased range of motion;Pain;Decreased mobility;Decreased knowledge of precautions;Decreased knowledge of use of DME ? ?   ?  ?PT Treatment Interventions DME instruction;Therapeutic exercise;Gait training;Stair training;Functional mobility training;Therapeutic activities;Patient/family education   ? ?PT Goals (Current goals can be found in the Care Plan section)  ?Acute Rehab PT Goals ?Patient Stated Goal[place] walk [place] the beach ?PT Goal Formulation: With patient ?Time For Goal Achievement: 02/10/22 ?Potential to Achieve Goals: Good ? ?  ?Frequency BID ?  ? ? ?Co-evaluation   ?  ?  ?  ?  ? ? ?  ?AM-PAC PT "6 Clicks" Mobility  ?Outcome Measure Help needed turning from your back to your side while in a flat bed without using bedrails?: A Little ?Help needed moving from lying on your back to sitting on the side of a flat bed without using bedrails?: A Little ?Help needed moving to and from a bed to a chair (including a wheelchair)?: A Little ?Help needed standing up from a chair using your arms (e.g., wheelchair or bedside chair)?: A Little ?Help needed to walk in hospital room?: A Little ?Help needed climbing 3-5 steps with a railing? : A Lot ?6 Click Score: 17 ? ?  ?End of Session Equipment Utilized During Treatment: Gait belt ?Activity Tolerance: Patient tolerated treatment well ?Patient  left: in chair;with call bell/phone within reach (chair alarm pad under pt, but no box in room, RN notified.) ?Nurse Communication: Mobility status ?PT Visit Diagnosis: Other abnormalities of gait and mobility (R26.89);Pain ?Pain - Right/Left: Right ?Pain - part of body: Knee ?  ? ?Time: 1829-9371 ?PT Time Calculation (min) (ACUTE ONLY): 24 min ? ? ?Charges:   PT Evaluation ?$PT Eval Moderate Complexity: 1 Mod ?PT Treatments ?$Gait Training: 8-22 mins ?  ?   ? ? ?Blondell Reveal Kistler PT 02/03/2022  ?Acute Rehabilitation Services ?Pager [phone] ?Office [phone] ? ? ?

## 2022-02-03 NOTE — Plan of Care (Signed)
  Problem: Pain Managment: Goal: General experience of comfort will improve Outcome: Progressing   Problem: Safety: Goal: Ability to remain free from injury will improve Outcome: Progressing   

## 2022-02-03 NOTE — Discharge Instructions (Signed)

## 2022-02-03 NOTE — Anesthesia Procedure Notes (Signed)
Spinal ? ?Patient location during procedure: OR ?Start time: 02/03/2022 9:14 AM ?End time: 02/03/2022 9:21 AM ?Reason for block: surgical anesthesia ?Staffing ?Performed: anesthesiologist  ?Anesthesiologist: Myrtie Soman, MD ?Preanesthetic Checklist ?Completed: patient identified, IV checked, site marked, risks and benefits discussed, surgical consent, monitors and equipment checked, pre-op evaluation and timeout performed ?Spinal Block ?Patient position: sitting ?Prep: Betadine ?Patient monitoring: heart rate, continuous pulse ox and blood pressure ?Approach: midline ?Location: L4-5 ?Injection technique: single-shot ?Needle ?Needle type: Quincke  ?Needle gauge: 22 G ?Needle length: 9 cm ?Assessment ?Sensory level: T6 ?Events: CSF return and second provider ?Additional Notes ? ? ? ? ? ? ?

## 2022-02-03 NOTE — Op Note (Signed)
NAME:  Leonard Burke                  ?   ? MEDICAL RECORD NO.:  [id number]  ?   ?                        FACILITY:  [facility]  ?   ? PHYSICIAN:  Pietro Cassis. Alvan Dame, M.D.  DATE OF BIRTH:  08/03/1951  ?   ? DATE OF PROCEDURE:  02/03/2022   ?  ?   ?                             OPERATIVE REPORT  ?   ?   ? PREOPERATIVE DIAGNOSIS:  Right knee osteoarthritis.  ?   ? POSTOPERATIVE DIAGNOSIS:  Right knee osteoarthritis.  ?   ? FINDINGS:  The patient was noted to have complete loss of cartilage and  ? bone-on-bone arthritis with associated osteophytes in the medial and patellofemoral compartments of  ? the knee with a significant synovitis and associated effusion.  The patient had failed months of conservative treatment including medications, injection therapy, activity modification. ?   ? PROCEDURE:  Right total knee replacement.  ?   ? COMPONENTS USED:  DePuy Attune rotating platform posterior stabilized knee  ? system, a size 7 femur, 8 tibia, size 8 mm PS AOX insert, and 38 anatomic patellar  ? button.  ?   ? SURGEON:  Pietro Cassis. Alvan Dame, M.D.  ?   ? ASSISTANT:  Costella Hatcher, PA-C.  ?   ? ANESTHESIA:  Regional and Spinal.  ?   ? SPECIMENS:  None.  ?   ? COMPLICATION:  None.  ?   ? DRAINS:  None. ? ?EBL: <100 cc  ?   ? TOURNIQUET TIME:   ?Total Tourniquet Time Documented: ?Thigh (Right) - 33 minutes ?Total: Thigh (Right) - 33 minutes ? .  ?   ? The patient was stable to the recovery room.  ?   ? INDICATION FOR PROCEDURE:  Leonard Burke is a 71 y.o. male patient of  ? mine.  The patient had been seen, evaluated, and treated for months conservatively in the  ? office with medication, activity modification, and injections.  The patient had  ? radiographic changes of bone-on-bone arthritis with endplate sclerosis and osteophytes noted.  Based on the radiographic changes and failed conservative measures, the patient  ? decided to proceed with definitive treatment, total knee replacement.  Risks of infection, DVT, component  failure, need for revision surgery, neurovascular injury were reviewed in the office setting.  The postop course was reviewed stressing the efforts to maximize post-operative satisfaction and function.  Consent was obtained for benefit of pain  ? relief.  ?   ? PROCEDURE IN DETAIL:  The patient was brought to the operative theater.  ? Once adequate anesthesia, preoperative antibiotics, 2 gm of Ancef,1 gm of Tranexamic Acid, and 10 mg of Decadron administered, the patient was positioned supine with a right thigh tourniquet placed.  The  ?right lower extremity was prepped and draped in sterile fashion.  A time-  ? out was performed identifying the patient, planned procedure, and the appropriate extremity.  ?   ? The right lower extremity was placed in the [facility] leg holder.  The leg was  ? exsanguinated, tourniquet elevated to 250 mmHg.  A midline incision was  ?  made followed by median parapatellar arthrotomy.  Following initial  ? exposure, attention was first directed to the patella.  Precut  ? measurement was noted to be 25 mm.  I resected down to 14-15 mm and used a  ? 38 anatomic patellar button to restore patellar height as well as cover the cut surface.  ?   ? The lug holes were drilled and a metal shim was placed to protect the  ? patella from retractors and saw blade during the procedure.  ?   ? At this point, attention was now directed to the femur.  The femoral  ? canal was opened with a drill, irrigated to try to prevent fat emboli.  An  ? intramedullary rod was passed at 5 degrees valgus, 9 mm of bone was  ? resected off the distal femur.  Following this resection, the tibia was  ? subluxated anteriorly.  Using the extramedullary guide, 2 mm of bone was resected off  ? the proximal medial tibia.  We confirmed the gap would be  ? stable medially and laterally with a size 6 spacer block as well as confirmed that the tibial cut was perpendicular in the coronal plane, checking with an alignment rod.  ?   ?  Once this was done, I sized the femur to be a size 7 in the anterior-  ? posterior dimension, chose a standard component based on medial and  ? lateral dimension.  The size 7 rotation block was then pinned in  ? position anterior referenced using the C-clamp to set rotation.  The  ? anterior, posterior, and  chamfer cuts were made without difficulty nor  ? notching making certain that I was along the anterior cortex to help  ? with flexion gap stability.  ?   ? The final box cut was made off the lateral aspect of distal femur.  ?   ? At this point, the tibia was sized to be a size 8.  The size 8 tray was  ? then pinned in position through the medial third of the tubercle,  ? drilled, and keel punched.  Trial reduction was now carried with a 7 femur, ? 8 tibia, a size 8 mm PS insert, and the 38 anatomic patella botton.  The knee was brought to full extension with good flexion stability with the patella  ? tracking through the trochlea without application of pressure.  Given  ? all these findings the trial components removed.  Final components were  ? opened and cement was mixed.  The knee was irrigated with normal saline solution and pulse lavage.  The synovial lining was  ? then injected with 30 cc of 0.25% Marcaine with epinephrine, 1 cc of Toradol and 30 cc of NS for a total of 61 cc.  ?   ?Final implants were then cemented onto cleaned and dried cut surfaces of bone with the knee brought to extension with a size 8 mm PS trial insert.  ?   ? Once the cement had fully cured, excess cement was removed  ? throughout the knee.  I confirmed that I was satisfied with the range of  ? motion and stability, and the final size 8 mm PS AOX insert was chosen.  It was  ? placed into the knee.  ?   ? The tourniquet had been let down at 33 minutes.  No significant  ? hemostasis was required.  The extensor mechanism was then reapproximated using #1 Vicryl  and #1 Stratafix sutures with the knee  ? in flexion.  The  ? remaining  wound was closed with 2-0 Vicryl and running 4-0 Monocryl.  ? The knee was cleaned, dried, dressed sterilely using Dermabond and  ? Aquacel dressing.  The patient was then  ? brought to recovery room in stable condition, tolerating the procedure  ? well.  ? ?Please note that Physician Assistant, Costella Hatcher, PA-C was present for the entirety of the case, and was utilized for pre-operative positioning, peri-operative retractor management, general facilitation of the procedure and for primary wound closure at the end of the case. ?   ?   ?   ?   ? Pietro Cassis Alvan Dame, M.D.  ? ? ?02/03/2022 10:54 AM  ?

## 2022-02-03 NOTE — Transfer of Care (Signed)
Immediate Anesthesia Transfer of Care Note ? ?Patient: Leonard Burke ? ?Procedure(s) Performed: TOTAL KNEE ARTHROPLASTY (Right: Knee) ? ?Patient Location: PACU ? ?Anesthesia Type:Spinal ? ?Level of Consciousness: drowsy ? ?Airway & Oxygen Therapy: Patient Spontanous Breathing and Patient connected to face mask oxygen ? ?Post-op Assessment: Report given to RN and Post -op Vital signs reviewed and stable ? ?Post vital signs: Reviewed and stable ? ?Last Vitals:  ?Vitals Value Taken Time  ?BP 96/62   ?Temp    ?Pulse 68 02/03/22 1115  ?Resp 10 02/03/22 1115  ?SpO2 100 % 02/03/22 1115  ?Vitals shown include unvalidated device data. ? ?Last Pain:  ?Vitals:  ? 02/03/22 0832  ?TempSrc:   ?PainSc: 0-No pain  ?   ? ?Patients Stated Pain Goal: 4 (02/03/22 3825) ? ?Complications: No notable events documented. ?

## 2022-02-03 NOTE — Anesthesia Procedure Notes (Signed)
Procedure Name: [place] ?Date/Time: 02/03/2022 9:09 AM ?Performed by: Niel Hummer, CRNA ?Pre-anesthesia Checklist: Patient identified, Emergency Drugs available, Suction available and Patient being monitored ?Oxygen Delivery Method: Simple face mask ? ? ? ? ?

## 2022-02-03 NOTE — Plan of Care (Signed)
?  Problem: Education: ?Goal: Knowledge of General Education information will improve ?Description: Including pain rating scale, medication(s)/side effects and non-pharmacologic comfort measures ?Outcome: Progressing ?  ?Problem: Clinical Measurements: ?Goal: Ability to maintain clinical measurements within normal limits will improve ?Outcome: Progressing ?  ?Problem: Pain Managment: ?Goal: General experience of comfort will improve ?Outcome: Progressing ?  ?Problem: Safety: ?Goal: Ability to remain free from injury will improve ?Outcome: Progressing ?  ?Problem: Education: ?Goal: Knowledge of the prescribed therapeutic regimen will improve ?Outcome: Progressing ?  ?Problem: Skin Integrity: ?Goal: Will show signs of wound healing ?Outcome: Progressing ?  ?Problem: Activity: ?Goal: Range of joint motion will improve ?Outcome: Progressing ?  ?

## 2022-02-04 ENCOUNTER — Encounter (HOSPITAL_COMMUNITY): Payer: Self-pay | Admitting: Orthopedic Surgery

## 2022-02-04 DIAGNOSIS — Z96652 Presence of left artificial knee joint: Secondary | ICD-10-CM | POA: Diagnosis not present

## 2022-02-04 DIAGNOSIS — J45909 Unspecified asthma, uncomplicated: Secondary | ICD-10-CM | POA: Diagnosis not present

## 2022-02-04 DIAGNOSIS — Z79899 Other long term (current) drug therapy: Secondary | ICD-10-CM | POA: Diagnosis not present

## 2022-02-04 DIAGNOSIS — M1711 Unilateral primary osteoarthritis, right knee: Secondary | ICD-10-CM | POA: Diagnosis not present

## 2022-02-04 DIAGNOSIS — Z8546 Personal history of malignant neoplasm of prostate: Secondary | ICD-10-CM | POA: Diagnosis not present

## 2022-02-04 DIAGNOSIS — M25761 Osteophyte, right knee: Secondary | ICD-10-CM | POA: Diagnosis not present

## 2022-02-04 DIAGNOSIS — Z87891 Personal history of nicotine dependence: Secondary | ICD-10-CM | POA: Diagnosis not present

## 2022-02-04 DIAGNOSIS — I10 Essential (primary) hypertension: Secondary | ICD-10-CM | POA: Diagnosis not present

## 2022-02-04 DIAGNOSIS — M659 Synovitis and tenosynovitis, unspecified: Secondary | ICD-10-CM | POA: Diagnosis not present

## 2022-02-04 DIAGNOSIS — M25461 Effusion, right knee: Secondary | ICD-10-CM | POA: Diagnosis not present

## 2022-02-04 LAB — CBC
HCT: 32.3 % — ABNORMAL LOW (ref 39.0–52.0)
Hemoglobin: 11 g/dL — ABNORMAL LOW (ref 13.0–17.0)
MCH: 30.9 pg (ref 26.0–34.0)
MCHC: 34.1 g/dL (ref 30.0–36.0)
MCV: 90.7 fL (ref 80.0–100.0)
Platelets: 156 10*3/uL (ref 150–400)
RBC: 3.56 MIL/uL — ABNORMAL LOW (ref 4.22–5.81)
RDW: 13.1 % (ref 11.5–15.5)
WBC: 14.2 10*3/uL — ABNORMAL HIGH (ref 4.0–10.5)
nRBC: 0 % (ref 0.0–0.2)

## 2022-02-04 LAB — BASIC METABOLIC PANEL
Anion gap: 7 (ref 5–15)
BUN: 36 mg/dL — ABNORMAL HIGH (ref 8–23)
CO2: 24 mmol/L (ref 22–32)
Calcium: 9.2 mg/dL (ref 8.9–10.3)
Chloride: 104 mmol/L (ref 98–111)
Creatinine, Ser: 1.22 mg/dL (ref 0.61–1.24)
GFR, Estimated: >60 mL/min (ref >60)
Glucose, Bld: 149 mg/dL — ABNORMAL HIGH (ref 70–99)
Potassium: 4.3 mmol/L (ref 3.5–5.1)
Sodium: 135 mmol/L (ref 135–145)

## 2022-02-04 MED ORDER — HYDROMORPHONE HCL 2 MG PO TABS: 2.0000 mg | ORAL_TABLET | ORAL | 0 refills | Status: DC | PRN

## 2022-02-04 MED ORDER — ASPIRIN 81 MG PO CHEW: 81.0000 mg | CHEWABLE_TABLET | Freq: Two times a day (BID) | ORAL | 0 refills | Status: AC

## 2022-02-04 MED ORDER — METHOCARBAMOL 500 MG PO TABS: 500.0000 mg | ORAL_TABLET | Freq: Four times a day (QID) | ORAL | 0 refills | Status: DC | PRN

## 2022-02-04 NOTE — Progress Notes (Signed)
Physical Therapy Treatment ?Patient Details ?Name: Leonard Burke ?MRN: [id number] ?DOB: 09/27/1951 ?Today's Date: 02/04/2022 ? ? ?History of Present Illness 71 y.o. male admitted 02/03/22 for R TKA. PMH includes L TKA 12/09/21, prostate cancer. ? ?  ?PT Comments  ? ? Pt has made excellent progress with mobility, he ambulated 450' with RW, no loss of balance. STair training completed. Reviewed TKA HEP, pt demonstrates good understanding. He is ready to DC home from a PT standpoint.    ?Recommendations for follow up therapy are one component of a multi-disciplinary discharge planning process, led by the attending physician.  Recommendations may be updated based on patient status, additional functional criteria and insurance authorization. ? ?Follow Up Recommendations ? Follow physician's recommendations for discharge plan and follow up therapies ?  ?  ?Assistance Recommended at Discharge Intermittent Supervision/Assistance  ?Patient can return home with the following A little help with walking and/or transfers;A little help with bathing/dressing/bathroom;Help with stairs or ramp for entrance;Assist for transportation ?  ?Equipment Recommendations ? None recommended by PT  ?  ?Recommendations for Other Services   ? ? ?  ?Precautions / Restrictions Precautions ?Precautions: Fall;Knee ?Precaution Booklet Issued: Yes (comment) ?Precaution Comments: reviewed no pillow under knee ?Restrictions ?RLE Weight Bearing: Weight bearing as tolerated ?LLE Weight Bearing: Weight bearing as tolerated ?Other Position/Activity Restrictions: WBAT RLE  ?  ? ?Mobility ? Bed Mobility ?Overal bed mobility: Modified Independent ?Bed Mobility: Supine to Sit ?  ?  ?Supine to sit: Modified independent (Device/Increase time) ?  ?  ?General bed mobility comments: HOB up, used rail ?  ? ?Transfers ?Overall transfer level: Needs assistance ?Equipment used: Rolling walker (2 wheels) ?Transfers: Sit to/from Stand ?Sit to Stand: From elevated surface,  Modified independent (Device/Increase time) ?  ?  ?  ?  ?  ?  ?  ? ?Ambulation/Gait ?Ambulation/Gait assistance: Modified independent (Device/Increase time) ?Gait Distance (Feet): 450 Feet ?Assistive device: Rolling walker (2 wheels) ?Gait Pattern/deviations: Step-to pattern, Antalgic, Step-through pattern, Decreased stance time - right ?Gait velocity: WFL ?  ?  ?General Gait Details: steady, good sequencing ? ? ?Stairs ?Stairs: Yes ?  ?Stair Management: One rail Left, Step to pattern, Forwards ?Number of Stairs: 3 ?General stair comments: VCs sequencing, demonstrates good understanding ? ? ?Wheelchair Mobility ?  ? ?Modified Rankin (Stroke Patients Only) ?  ? ? ?  ?Balance Overall balance assessment: Modified Independent ?  ?  ?  ?  ?  ?  ?  ?  ?  ?  ?  ?  ?  ?  ?  ?  ?  ?  ?  ? ?  ?Cognition Arousal/Alertness: Awake/alert ?Behavior During Therapy: [facility] for tasks assessed/performed ?Overall Cognitive Status: Within Functional Limits for tasks assessed ?  ?  ?  ?  ?  ?  ?  ?  ?  ?  ?  ?  ?  ?  ?  ?  ?  ?  ?  ? ?  ?Exercises Total Joint Exercises ?Ankle Circles/Pumps: AROM, 10 reps, Both ?Quad Sets: AROM, Right, 5 reps ?Short Arc Quad: AROM, Right, 5 reps, Supine ?Heel Slides: AAROM, Supine, Right, 5 reps ?Hip ABduction/ADduction: AROM, Right, 5 reps ?Straight Leg Raises: AROM, Right, 5 reps, Supine ?Long Arc Quad: AROM, Right, 5 reps, Seated ?Knee Flexion: Seated, Right, 5 reps, AROM ?Goniometric ROM: 3-75* AAROM R knee ? ?  ?General Comments   ?  ?  ? ?Pertinent Vitals/Pain Pain Assessment ?Pain Score: 7  ?Pain Location:  R knee ?Pain Descriptors / Indicators: Sore, Aching, Tightness ?Pain Intervention(s): Limited activity within patient's tolerance, Monitored during session, Patient requesting pain meds-RN notified  ? ? ?Home Living   ?  ?  ?  ?  ?  ?  ?  ?  ?  ?   ?  ?Prior Function    ?  ?  ?   ? ?PT Goals (current goals can now be found in the care plan section) Acute Rehab PT Goals ?Patient [place] ?PT Goal Formulation: With patient ?Time For Goal Achievement: 02/10/22 ?Potential to Achieve Goals: Good ?Progress towards PT goals: Goals met/education completed, patient discharged from PT ? ?  ?Frequency ? ? ? BID ? ? ? ?  ?PT Plan Current plan remains appropriate  ? ? ?Co-evaluation   ?  ?  ?  ?  ? ?  ?AM-PAC PT "6 Clicks" Mobility   ?Outcome Measure ? Help needed turning from your back to your side while in a flat bed without using bedrails?: None ?Help needed moving from lying on your back to sitting on the side of a flat bed without using bedrails?: None ?Help needed moving to and from a bed to a chair (including a wheelchair)?: None ?Help needed standing up from a chair using your arms (e.g., wheelchair or bedside chair)?: None ?Help needed to walk in hospital room?: None ?Help needed climbing 3-5 steps with a railing? : None ?6 Click Score: 24 ? ?  ?End of Session Equipment Utilized During Treatment: Gait belt ?Activity Tolerance: Patient tolerated treatment well ?Patient left: in chair;with call bell/phone within reach;with nursing/sitter in room;with chair alarm set (chair alarm pad under pt, but no box in room, RN notified.) ?Nurse Communication: Mobility status ?PT Visit Diagnosis: Other abnormalities of gait and mobility (R26.89);Pain ?Pain - Right/Left: Right ?Pain - part of body: Knee ?  ? ? ?Time: 7519-8242 ?PT Time Calculation (min) (ACUTE ONLY): 24 min ? ?Charges:  $Gait Training: 8-22 mins ?$Therapeutic Exercise: 8-22 mins          ?          ? ?Philomena Doheny PT 02/04/2022  ?Acute Rehabilitation Services ?Pager [phone] ?Office [phone] ? ? ?

## 2022-02-04 NOTE — TOC Transition Note (Signed)
Transition of Care (TOC) - CM/SW Discharge Note ? ? ?Patient Details  ?Name: Leonard Burke ?MRN: [id number] ?Date of Birth: January 13, 1951 ? ?Transition of Care (TOC) CM/SW Contact:  ?Hayleigh Bawa, LCSW ?Phone Number: ?02/04/2022, 12:07 PM ? ? ?Clinical Narrative:    ?Met briefly with pt and confirming he has needed DME at home.  Plan for OPPT at [facility]).  No TOC needs. ? ? ?Final next level of care: OP Rehab ?Barriers to Discharge: No Barriers Identified ? ? ?Patient Goals and CMS Choice ?Patient states their goals for this hospitalization and ongoing recovery are:: return home ?  ?  ? ?Discharge Placement ?  ?           ?  ?  ?  ?  ? ?Discharge Plan and Services ?  ?  ?           ?  ?  ?  ?  ?  ?  ?  ?  ?  ?  ? ?Social Determinants of Health (SDOH) Interventions ?  ? ? ?Readmission Risk Interventions ?   ? View : No data to display.  ?  ?  ?  ? ? ? ? ? ?

## 2022-02-04 NOTE — Progress Notes (Signed)
? ?  Subjective: ?1 Day Post-Op Procedure(s) (LRB): ?TOTAL KNEE ARTHROPLASTY (Right) ?Patient reports pain as mild.   ?Patient seen in rounds for Dr. Alvan Dame. ?Patient is well, and has had no acute complaints or problems. No acute events overnight. Voiding without difficulty now, although he reports it was difficult to urinate at first after surgery yesterday. Patient ambulated 120 feet with PT.  ?We will continue therapy today.  ? ?Objective: ?Vital signs in last 24 hours: ?Temp:  [97.6 ?F (36.4 ?C)-98 ?F (36.7 ?C)] 97.7 ?F (36.5 ?C) (03/31 6578) ?Pulse Rate:  [62-78] 78 (03/31 0552) ?Resp:  [10-19] 17 (03/31 0552) ?BP: (87-122)/(61-98) 118/68 (03/31 4696) ?SpO2:  [94 %-100 %] 95 % (03/31 0552) ? ?Intake/Output from previous day: ? ?Intake/Output Summary (Last 24 hours) at 02/04/2022 0829 ?Last data filed at 02/04/2022 0600 ?Gross per 24 hour  ?Intake 2761.17 ml  ?Output 750 ml  ?Net 2011.17 ml  ?  ? ?Intake/Output this shift: ?No intake/output data recorded. ? ?Labs: ?Recent Labs  ?  02/04/22 ?2952  ?HGB 11.0*  ? ?Recent Labs  ?  02/04/22 ?8413  ?WBC 14.2*  ?RBC 3.56*  ?HCT 32.3*  ?PLT 156  ? ?Recent Labs  ?  02/04/22 ?2440  ?NA 135  ?K 4.3  ?CL 104  ?CO2 24  ?BUN 36*  ?CREATININE 1.22  ?GLUCOSE 149*  ?CALCIUM 9.2  ? ?No results for input(s): LABPT, INR in the last 72 hours. ? ?Exam: ?General - Patient is Alert and Oriented ?Extremity - Neurologically intact ?Sensation intact distally ?Intact pulses distally ?Dorsiflexion/Plantar flexion intact ?Dressing - dressing C/D/I ?Motor Function - intact, moving foot and toes well on exam.  ? ?Past Medical History:  ?Diagnosis Date  ? Arthritis   ? Asthma   ? HTN (hypertension)   ? Hypercholesteremia   ? Hypertriglyceridemia   ? ? ?Assessment/Plan: ?1 Day Post-Op Procedure(s) (LRB): ?TOTAL KNEE ARTHROPLASTY (Right) ?Principal Problem: ?  S/P total knee arthroplasty, right ? ?Estimated body mass index is 31.57 kg/m? as calculated from the following: ?  Height as of this  encounter: '5\' 10"'$  (1.778 m). ?  Weight as of this encounter: 99.8 kg. ?Advance diet ?Up with therapy ?D/C IV fluids ? ? ?Patient's anticipated LOS is less than 2 midnights, meeting these requirements: ?- Younger than 54 ?- Lives within 1 hour of care ?- Has a competent adult at home to recover with post-op recover ?- NO history of ? - Chronic pain requiring opiods ? - Diabetes ? - Coronary Artery Disease ? - Heart failure ? - Heart attack ? - Stroke ? - DVT/VTE ? - Cardiac arrhythmia ? - Respiratory Failure/COPD ? - Renal failure ? - Anemia ? - Advanced Liver disease ? ?  ? ?DVT Prophylaxis - Aspirin ?Weight bearing as tolerated. ? ?Hgb stable at 11.0 this AM. ?Cr. Elevated to 1.22 this AM, will discontinue celebrex.  ? ?Plan is to go Home after hospital stay. Plan for discharge today following 1-2 sessions of PT as long as they are meeting their goals. Patient is scheduled for OPPT. Follow up in the office in 2 weeks.  ? ?Griffith Citron, PA-C ?Orthopedic Surgery ?[phone] ?02/04/2022, 8:29 AM  ?

## 2022-02-09 DIAGNOSIS — M25561 Pain in right knee: Secondary | ICD-10-CM | POA: Diagnosis not present

## 2022-02-11 DIAGNOSIS — M25561 Pain in right knee: Secondary | ICD-10-CM | POA: Diagnosis not present

## 2022-02-11 NOTE — Discharge Summary (Signed)
Patient ID: ?ARTIST BLOOM ?MRN: [id number] ?DOB/AGE: [birth year] 70 y.o. ? ?Admit date: 02/03/2022 ?Discharge date: 02/04/2022 ? ?Admission Diagnoses:  ?Right knee osteoarthritis ? ?Discharge Diagnoses:  ?Principal Problem: ?  S/P total knee arthroplasty, right ? ? ?Past Medical History:  ?Diagnosis Date  ? Arthritis   ? Asthma   ? HTN (hypertension)   ? Hypercholesteremia   ? Hypertriglyceridemia   ? ? ?Surgeries: Procedure(s): ?TOTAL KNEE ARTHROPLASTY on 02/03/2022 ?  ?Consultants:  ? ?Discharged Condition: Improved ? ?Hospital Course: Leonard Burke is an 71 y.o. male who was admitted 02/03/2022 for operative treatment ofS/P total knee arthroplasty, right. Patient has severe unremitting pain that affects sleep, daily activities, and work/hobbies. After pre-op clearance the patient was taken to the operating room on 02/03/2022 and underwent  Procedure(s): ?TOTAL KNEE ARTHROPLASTY.   ? ?Patient was given perioperative antibiotics:  ?Anti-infectives (From admission, onward)  ? ? Start     Dose/Rate Route Frequency Ordered Stop  ? 02/03/22 1530  ceFAZolin (ANCEF) IVPB 2g/100 mL premix       ? 2 g ?200 mL/hr over 30 Minutes Intravenous Every 6 hours 02/03/22 1304 02/04/22 0932  ? 02/03/22 0615  ceFAZolin (ANCEF) IVPB 2g/100 mL premix       ? 2 g ?200 mL/hr over 30 Minutes Intravenous On call to O.R. 02/03/22 0600 02/03/22 0952  ? ?  ?  ? ?Patient was given sequential compression devices, early ambulation, and chemoprophylaxis to prevent DVT. Patient worked with PT and was meeting their goals regarding safe ambulation and transfers. ? ?Patient benefited maximally from hospital stay and there were no complications.   ? ?Recent vital signs: No data found.  ? ?Recent laboratory studies: No results for input(s): WBC, HGB, HCT, PLT, NA, K, CL, CO2, BUN, CREATININE, GLUCOSE, INR, CALCIUM in the last 72 hours. ? ?Invalid input(s): PT, 2 ? ? ?Discharge Medications:   ?Allergies as of 02/04/2022   ? ?   Reactions  ? Codeine  Nausea And Vomiting  ? Penicillins Rash  ? Tolerated Cephalosporin 12/09/21  ? Ivp Dye [iodinated Contrast Media] Hives  ? ?  ? ?  ?Medication List  ?  ? ?STOP taking these medications   ? ?celecoxib 200 MG capsule ?Commonly known as: CELEBREX ?  ? ?  ? ?TAKE these medications   ? ?acetaminophen 325 MG tablet ?Commonly known as: TYLENOL ?Take 3 tablets (975 mg total) by mouth every 6 (six) hours. ?  ?amLODipine 10 MG tablet ?Commonly known as: NORVASC ?Take 10 mg by mouth daily. ?  ?aspirin 81 MG chewable tablet ?Chew 1 tablet (81 mg total) by mouth 2 (two) times daily for 28 days. ?  ?atenolol 50 MG tablet ?Commonly known as: TENORMIN ?Take 1 tablet (50 mg total) by mouth daily. ?  ?docusate sodium 100 MG capsule ?Commonly known as: COLACE ?Take 1 capsule (100 mg total) by mouth 2 (two) times daily. ?  ?Eye Drops 0.012-0.2 % Soln ?Generic drug: Naphazoline-Polyethyl Glycol ?Place 1 drop into both eyes daily as needed (Allergies/dry eyes). ?  ?fenofibrate 145 MG tablet ?Commonly known as: TRICOR ?Take 1 tablet (145 mg total) by mouth daily. ?  ?HYDROmorphone 2 MG tablet ?Commonly known as: DILAUDID ?Take 1-2 tablets (2-4 mg total) by mouth every 4 (four) hours as needed for severe pain. ?  ?methocarbamol 500 MG tablet ?Commonly known as: ROBAXIN ?Take 1 tablet (500 mg total) by mouth every 6 (six) hours as needed for muscle spasms. ?  ?olmesartan 20  MG tablet ?Commonly known as: BENICAR ?Take 1 tablet (20 mg total) by mouth daily. ?  ?polyethylene glycol 17 g packet ?Commonly known as: MIRALAX / GLYCOLAX ?Take 17 g by mouth daily as needed for mild constipation. ?  ?rosuvastatin 5 MG tablet ?Commonly known as: CRESTOR ?Take 5 mg by mouth daily. ?  ?Ventolin HFA 108 (90 Base) MCG/ACT inhaler ?Generic drug: albuterol ?Inhale 90 mcg into the lungs every 4 (four) hours as needed for wheezing or shortness of breath. ?  ?zolpidem 10 MG tablet ?Commonly known as: AMBIEN ?Take 10 mg by mouth daily as needed for sleep. ?  ? ?   ? ?  ?  ? ? ?  ?Discharge Care Instructions  ?(From admission, onward)  ?  ? ? ?  ? ?  Start     Ordered  ? 02/04/22 0000  Change dressing       ?Comments: Maintain surgical dressing until follow up in the clinic. If the edges start to pull up, may reinforce with tape. If the dressing is no longer working, may remove and cover with gauze and tape, but must keep the area dry and clean.  Call with any questions or concerns.  ? 02/04/22 1914  ? ?  ?  ? ?  ? ? ?Diagnostic Studies: No results found. ? ?Disposition: Discharge disposition: 01-Home or Self Care ? ? ? ? ? ? ?Discharge Instructions   ? ? Call MD / Call 911   Complete by: As directed ?  ? If you experience chest pain or shortness of breath, CALL 911 and be transported to the hospital emergency room.  If you develope a fever above 101 F, pus (white drainage) or increased drainage or redness at the wound, or calf pain, call your surgeon's office.  ? Change dressing   Complete by: As directed ?  ? Maintain surgical dressing until follow up in the clinic. If the edges start to pull up, may reinforce with tape. If the dressing is no longer working, may remove and cover with gauze and tape, but must keep the area dry and clean.  Call with any questions or concerns.  ? Constipation Prevention   Complete by: As directed ?  ? Drink plenty of fluids.  Prune juice may be helpful.  You may use a stool softener, such as Colace (over the counter) 100 mg twice a day.  Use MiraLax (over the counter) for constipation as needed.  ? Diet - low sodium heart healthy   Complete by: As directed ?  ? Increase activity slowly as tolerated   Complete by: As directed ?  ? Weight bearing as tolerated with assist device (walker, cane, etc) as directed, use it as long as suggested by your surgeon or therapist, typically at least 4-6 weeks.  ? Post-operative opioid taper instructions:   Complete by: As directed ?  ? POST-OPERATIVE OPIOID TAPER INSTRUCTIONS: ?It is important to wean off of  your opioid medication as soon as possible. If you do not need pain medication after your surgery it is ok to stop day one. ?Opioids include: ?Codeine, Hydrocodone(Norco, Vicodin), Oxycodone(Percocet, oxycontin) and hydromorphone amongst others.  ?Long term and even short term use of opiods can cause: ?Increased pain response ?Dependence ?Constipation ?Depression ?Respiratory depression ?And more.  ?Withdrawal symptoms can include ?Flu like symptoms ?Nausea, vomiting ?And more ?Techniques to manage these symptoms ?Hydrate well ?Eat regular healthy meals ?Stay active ?Use relaxation techniques(deep breathing, meditating, yoga) ?Do Not substitute Alcohol to  help with tapering ?If you have been on opioids for less than two weeks and do not have pain than it is ok to stop all together.  ?Plan to wean off of opioids ?This plan should start within one week post op of your joint replacement. ?Maintain the same interval or time between taking each dose and first decrease the dose.  ?Cut the total daily intake of opioids by one tablet each day ?Next start to increase the time between doses. ?The last dose that should be eliminated is the evening dose.  ? ?  ? TED hose   Complete by: As directed ?  ? Use stockings (TED hose) for 2 weeks on both leg(s).  You may remove them at night for sleeping.  ? ?  ? ? ? Follow-up Information   ? ? Paralee Cancel, MD. Schedule an appointment as soon as possible for a visit in 2 week(s).   ?Specialty: Orthopedic Surgery ?Contact information: ?[address] ?[address] ?[place] [address] ?[phone] ? ? ?  ?  ? ?  ?  ? ?  ? ? ? ?Signed: ?Irving Copas ?02/11/2022, 10:50 AM ? ? ? ?

## 2022-02-14 DIAGNOSIS — M25561 Pain in right knee: Secondary | ICD-10-CM | POA: Diagnosis not present

## 2022-02-16 DIAGNOSIS — M25561 Pain in right knee: Secondary | ICD-10-CM | POA: Diagnosis not present

## 2022-02-18 DIAGNOSIS — M25561 Pain in right knee: Secondary | ICD-10-CM | POA: Diagnosis not present

## 2022-02-21 DIAGNOSIS — M25561 Pain in right knee: Secondary | ICD-10-CM | POA: Diagnosis not present

## 2022-03-02 DIAGNOSIS — M25561 Pain in right knee: Secondary | ICD-10-CM | POA: Diagnosis not present

## 2022-03-04 DIAGNOSIS — M25561 Pain in right knee: Secondary | ICD-10-CM | POA: Diagnosis not present

## 2022-03-07 DIAGNOSIS — M25561 Pain in right knee: Secondary | ICD-10-CM | POA: Diagnosis not present

## 2022-03-10 DIAGNOSIS — M25561 Pain in right knee: Secondary | ICD-10-CM | POA: Diagnosis not present

## 2022-03-14 DIAGNOSIS — M25561 Pain in right knee: Secondary | ICD-10-CM | POA: Diagnosis not present

## 2022-03-17 DIAGNOSIS — M25561 Pain in right knee: Secondary | ICD-10-CM | POA: Diagnosis not present

## 2022-03-23 DIAGNOSIS — Z4789 Encounter for other orthopedic aftercare: Secondary | ICD-10-CM | POA: Diagnosis not present

## 2022-04-18 DIAGNOSIS — I10 Essential (primary) hypertension: Secondary | ICD-10-CM | POA: Diagnosis not present

## 2022-04-18 DIAGNOSIS — E782 Mixed hyperlipidemia: Secondary | ICD-10-CM | POA: Diagnosis not present

## 2022-04-18 DIAGNOSIS — R801 Persistent proteinuria, unspecified: Secondary | ICD-10-CM | POA: Diagnosis not present

## 2022-04-18 DIAGNOSIS — G4700 Insomnia, unspecified: Secondary | ICD-10-CM | POA: Diagnosis not present

## 2022-04-18 DIAGNOSIS — M1990 Unspecified osteoarthritis, unspecified site: Secondary | ICD-10-CM | POA: Diagnosis not present

## 2022-05-03 DIAGNOSIS — H3562 Retinal hemorrhage, left eye: Secondary | ICD-10-CM | POA: Diagnosis not present

## 2022-05-03 DIAGNOSIS — H348312 Tributary (branch) retinal vein occlusion, right eye, stable: Secondary | ICD-10-CM | POA: Diagnosis not present

## 2022-05-03 DIAGNOSIS — H353112 Nonexudative age-related macular degeneration, right eye, intermediate dry stage: Secondary | ICD-10-CM | POA: Diagnosis not present

## 2022-05-03 DIAGNOSIS — H35033 Hypertensive retinopathy, bilateral: Secondary | ICD-10-CM | POA: Diagnosis not present

## 2022-05-17 DIAGNOSIS — N3000 Acute cystitis without hematuria: Secondary | ICD-10-CM | POA: Diagnosis not present

## 2022-06-01 DIAGNOSIS — M19011 Primary osteoarthritis, right shoulder: Secondary | ICD-10-CM | POA: Diagnosis not present

## 2022-06-01 DIAGNOSIS — M19012 Primary osteoarthritis, left shoulder: Secondary | ICD-10-CM | POA: Diagnosis not present

## 2022-08-23 DIAGNOSIS — L219 Seborrheic dermatitis, unspecified: Secondary | ICD-10-CM | POA: Diagnosis not present

## 2022-08-23 DIAGNOSIS — L739 Follicular disorder, unspecified: Secondary | ICD-10-CM | POA: Diagnosis not present

## 2022-08-29 ENCOUNTER — Telehealth: Payer: Self-pay

## 2022-08-29 MED ORDER — PREDNISONE 50 MG PO TABS: ORAL_TABLET | ORAL | 0 refills | Status: DC

## 2022-08-29 MED ORDER — DIPHENHYDRAMINE HCL 50 MG PO TABS: 50.0000 mg | ORAL_TABLET | Freq: Once | ORAL | 0 refills | Status: DC

## 2022-08-29 NOTE — Telephone Encounter (Signed)
Phone call to patient to review instructions for 13 hr prep for MRI Prostate w/ contrast on 09/12/22 at 8:00AM. Prescription called into [facility]. Pt aware and verbalized understanding of instructions. Pt advised to have driver.    Prescription: Pt to take 50 mg of prednisone on 09/11/22 at 7:00PM, 50 mg of prednisone on 09/12/22 at 1:00 AM, and 50 mg of prednisone on 09/12/22 at 7:00 AM. Pt is also to take 50 mg of benadryl on 09/12/22 at 7:00 AM.   Please call [phone] with any questions.

## 2022-09-12 ENCOUNTER — Ambulatory Visit
Admission: RE | Admit: 2022-09-12 | Discharge: 2022-09-12 | Disposition: A | Discharge status: Home / Self Care | Payer: Medicare Other | Source: Ambulatory Visit | Attending: Urology | Admitting: Urology

## 2022-09-12 DIAGNOSIS — R972 Elevated prostate specific antigen [PSA]: Secondary | ICD-10-CM | POA: Diagnosis not present

## 2022-09-12 DIAGNOSIS — C61 Malignant neoplasm of prostate: Secondary | ICD-10-CM

## 2022-09-12 MED ORDER — GADOPICLENOL 0.5 MMOL/ML IV SOLN
10.0000 mL | Freq: Once | INTRAVENOUS | Status: AC | PRN
  Administered 2022-09-12: 10 mL via INTRAVENOUS

## 2022-09-20 DIAGNOSIS — N400 Enlarged prostate without lower urinary tract symptoms: Secondary | ICD-10-CM | POA: Diagnosis not present

## 2022-09-20 DIAGNOSIS — C61 Malignant neoplasm of prostate: Secondary | ICD-10-CM | POA: Diagnosis not present

## 2022-09-27 DIAGNOSIS — Z1211 Encounter for screening for malignant neoplasm of colon: Secondary | ICD-10-CM | POA: Diagnosis not present

## 2022-10-06 DIAGNOSIS — D3131 Benign neoplasm of right choroid: Secondary | ICD-10-CM | POA: Diagnosis not present

## 2022-10-24 DIAGNOSIS — H34831 Tributary (branch) retinal vein occlusion, right eye, with macular edema: Secondary | ICD-10-CM | POA: Diagnosis not present

## 2022-10-24 DIAGNOSIS — H43822 Vitreomacular adhesion, left eye: Secondary | ICD-10-CM | POA: Diagnosis not present

## 2022-10-24 DIAGNOSIS — H35033 Hypertensive retinopathy, bilateral: Secondary | ICD-10-CM | POA: Diagnosis not present

## 2022-10-24 DIAGNOSIS — D3131 Benign neoplasm of right choroid: Secondary | ICD-10-CM | POA: Diagnosis not present

## 2022-11-16 DIAGNOSIS — I10 Essential (primary) hypertension: Secondary | ICD-10-CM | POA: Diagnosis not present

## 2022-11-16 DIAGNOSIS — Z0000 Encounter for general adult medical examination without abnormal findings: Secondary | ICD-10-CM | POA: Diagnosis not present

## 2022-11-16 DIAGNOSIS — C61 Malignant neoplasm of prostate: Secondary | ICD-10-CM | POA: Diagnosis not present

## 2022-11-16 DIAGNOSIS — E782 Mixed hyperlipidemia: Secondary | ICD-10-CM | POA: Diagnosis not present

## 2022-11-16 DIAGNOSIS — J45909 Unspecified asthma, uncomplicated: Secondary | ICD-10-CM | POA: Diagnosis not present

## 2022-11-28 DIAGNOSIS — C61 Malignant neoplasm of prostate: Secondary | ICD-10-CM | POA: Diagnosis not present

## 2022-12-06 DIAGNOSIS — C61 Malignant neoplasm of prostate: Secondary | ICD-10-CM | POA: Diagnosis not present

## 2022-12-08 DIAGNOSIS — D1801 Hemangioma of skin and subcutaneous tissue: Secondary | ICD-10-CM | POA: Diagnosis not present

## 2022-12-08 DIAGNOSIS — B078 Other viral warts: Secondary | ICD-10-CM | POA: Diagnosis not present

## 2022-12-08 DIAGNOSIS — L821 Other seborrheic keratosis: Secondary | ICD-10-CM | POA: Diagnosis not present

## 2022-12-08 DIAGNOSIS — L578 Other skin changes due to chronic exposure to nonionizing radiation: Secondary | ICD-10-CM | POA: Diagnosis not present

## 2022-12-08 DIAGNOSIS — L814 Other melanin hyperpigmentation: Secondary | ICD-10-CM | POA: Diagnosis not present

## 2023-01-19 IMAGING — MR MR PROSTATE WO/W CM
12 series · 48 of 48 positions shown · IV contrast (multihance)
Comparison: None.
COMPARISON: None.

Addendum:
CLINICAL DATA: Prostate carcinoma, Gleason score 3+3=6. Active
surveillance.

EXAM:
MR PROSTATE WITHOUT AND WITH CONTRAST
TECHNIQUE: Multiplanar multisequence MRI images were obtained of the pelvis
centered about the prostate. Pre and post contrast images were
obtained.
CONTRAST:  20mL MULTIHANCE GADOBENATE DIMEGLUMINE 529 MG/ML IV SOLN

[Series 3: T2 · coronal · 3.0mm · 0.56mm/px · 1 of 25 slices shown (1 of 3)]
[im 1/25]
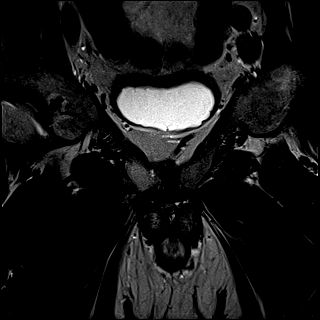

[Series 4: T1 · axial · 5.0mm · 1.25mm/px · z∈[-98,+97]mm · 2 of 80 slices shown]
[im 1/80]
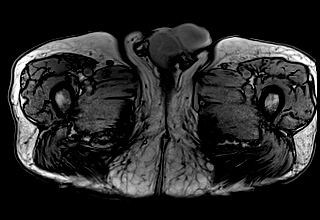
[im 80/80]
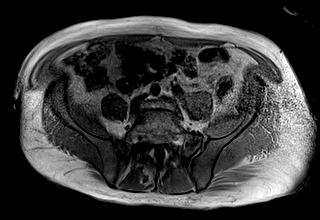

[Series 5: DWI · axial · 3.0mm · 1.75mm/px · z∈[-68,+1]mm · 2 of 72 slices shown (1 of 3)]
[im 1/72]
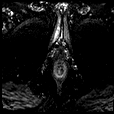
[im 72/72]
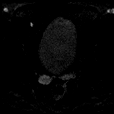

[Series 6: DWI · axial · 3.0mm · 1.75mm/px · 1 of 24 slices shown (2 of 3)]
[im 1/24]
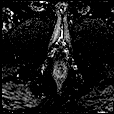

[Series 7: DWI · axial · 3.0mm · 1.75mm/px · 1 of 24 slices shown (3 of 3)]
[im 1/24]
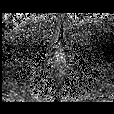

[Series 8: T2 · axial · 3.0mm · 0.56mm/px · 1 of 23 slices shown (2 of 3)]
[im 1/23]
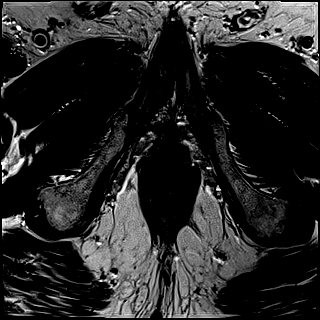

[Series 9: T2 · axial · 1.0mm · 1.04mm/px · z∈[-73,+6]mm · 2 of 80 slices shown (3 of 3)]
[im 1/80]
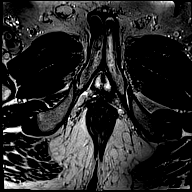
[im 80/80]
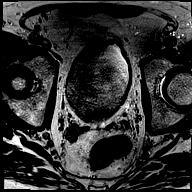

[Series 10: pre t1_twist_tra_dyn · axial · non-contrast · 3.5mm · 0.83mm/px · 1 of 20 slices shown]
[im 1/20]
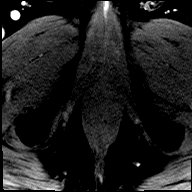

[Series 11: post t1_twist_tra_dyn-copy center · axial · non-contrast · 3.5mm · 0.83mm/px · z∈[-67,-1]mm · 17 of 600 slices shown]
[im 1/600]
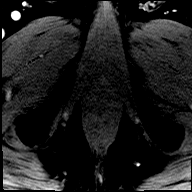
[im 38/600]
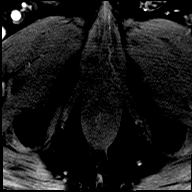
[im 75/600]
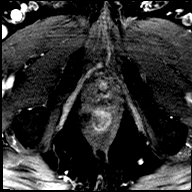
[im 113/600]
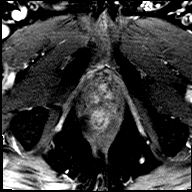
[im 150/600]
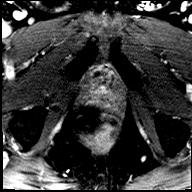
[im 188/600]
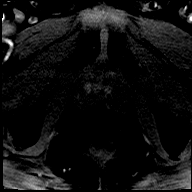
[im 225/600]
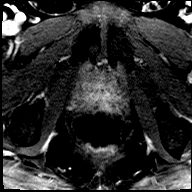
[im 263/600]
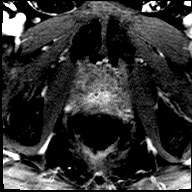
[im 300/600]
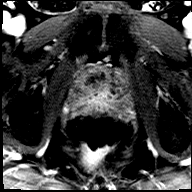
[im 337/600]
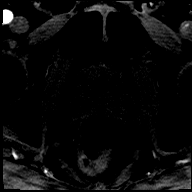
[im 375/600]
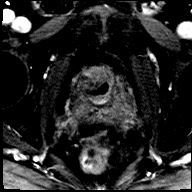
[im 412/600]
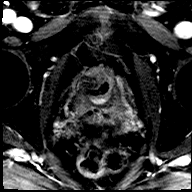
[im 450/600]
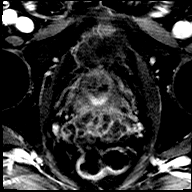
[im 487/600]
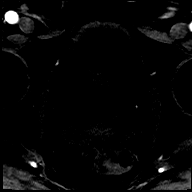
[im 525/600]
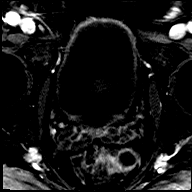
[im 562/600]
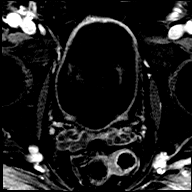
[im 600/600]
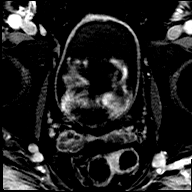

[Series 12: post t1_twist_tra_dyn-copy cent_sub · axial · 3.5mm · 0.83mm/px · z∈[-67,-1]mm · 16 of 580 slices shown]
[im 1/580]
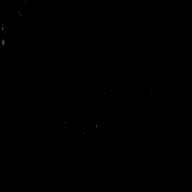
[im 39/580]
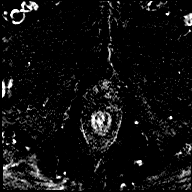
[im 78/580]
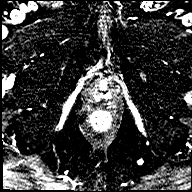
[im 116/580]
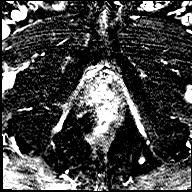
[im 155/580]
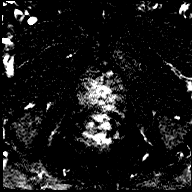
[im 194/580]
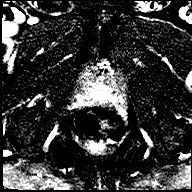
[im 232/580]
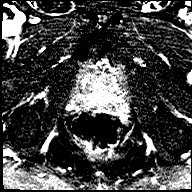
[im 271/580]
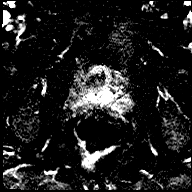
[im 309/580]
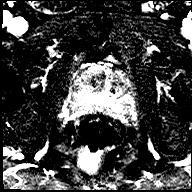
[im 348/580]
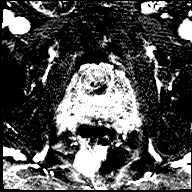
[im 387/580]
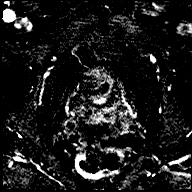
[im 425/580]
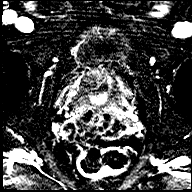
[im 464/580]
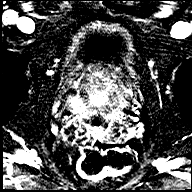
[im 502/580]
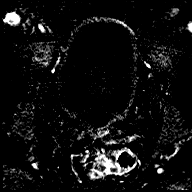
[im 541/580]
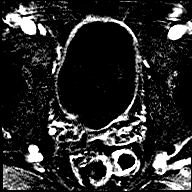
[im 580/580]
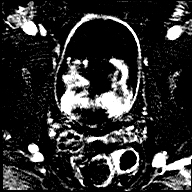

[Series 13: t1_vibe_dixon_tra_f · axial · 2.5mm · 0.91mm/px · z∈[-99,+98]mm · 2 of 80 slices shown]
[im 1/80]
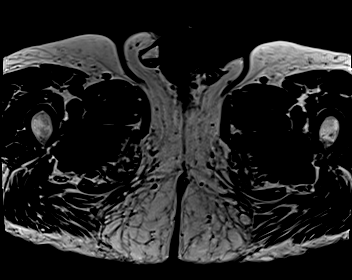
[im 80/80]
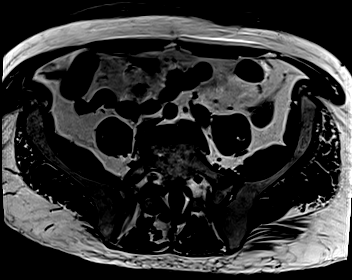

[Series 14: t1_vibe_dixon_tra_w · axial · 2.5mm · 0.91mm/px · z∈[-99,+98]mm · 2 of 80 slices shown]
[im 1/80]
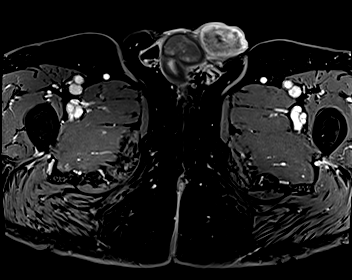
[im 80/80]
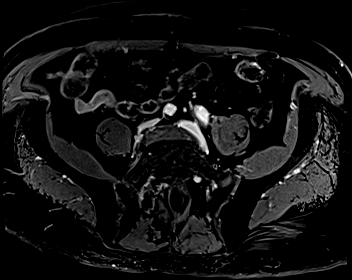

[48 of 48 positions shown; findings below may reference images not displayed]

FINDINGS: Prostate:

-- Peripheral Zone: A 10 x 7 mm T2 hypointense nodule is seen in the
right lateral apex, which shows marked ADC hypointensity, marked DWI
hyperintensity, and early focal contrast enhancement.

-- Transition/Central Zone: Mildly enlarged with a few circumscribed
BPH nodules noted, with a dominant nodule in the right base
measuring 2.6 cm. However, no suspicious nodules with
obscured/non-circumscribed margins or restricted diffusion seen.

-- Measurements/Volume:  5.1 x 4.6 x 5.7 cm (volume = 70 cm^3)

Transcapsular spread:  Absent

Seminal vesicle involvement:  Absent

Neurovascular bundle involvement:  Absent

Pelvic adenopathy: None visualized

Bone metastasis: None visualized

Other: Sigmoid diverticulosis, without evidence of diverticulitis.
Small right inguinal hernia containing only fat.
IMPRESSION: 10 mm peripheral zone nodule in the right lateral apex, suspicious
for high-grade carcinoma. PI-RADS 4: High (clinically significant
cancer is likely to be present)

No evidence of extracapsular extension or pelvic metastatic disease.

(I have post-processed this exam in the DynaCAD application for
MR/US fusion-guided biopsy if performed.)

ADDENDUM:
This addendum is to document that the patient had an apparent
contrast reaction shortly after completing the MRI consisting of
hives. I was called to see the patient who was in no distress. A few
hives were noted. The patient was otherwise asymptomatic with a
normal heart rate. The patient was observed for 20 minutes and
discharged in stable condition. This should be considered an
allergic contrast reaction in the event of future needs for
gadolinium based contrast agents.

*** End of Addendum ***
FINDINGS: Prostate:

-- Peripheral Zone: A 10 x 7 mm T2 hypointense nodule is seen in the
right lateral apex, which shows marked ADC hypointensity, marked DWI
hyperintensity, and early focal contrast enhancement.

-- Transition/Central Zone: Mildly enlarged with a few circumscribed
BPH nodules noted, with a dominant nodule in the right base
measuring 2.6 cm. However, no suspicious nodules with
obscured/non-circumscribed margins or restricted diffusion seen.

-- Measurements/Volume:  5.1 x 4.6 x 5.7 cm (volume = 70 cm^3)

Transcapsular spread:  Absent

Seminal vesicle involvement:  Absent

Neurovascular bundle involvement:  Absent

Pelvic adenopathy: None visualized

Bone metastasis: None visualized

Other: Sigmoid diverticulosis, without evidence of diverticulitis.
Small right inguinal hernia containing only fat.
IMPRESSION: 10 mm peripheral zone nodule in the right lateral apex, suspicious
for high-grade carcinoma. PI-RADS 4: High (clinically significant
cancer is likely to be present)

No evidence of extracapsular extension or pelvic metastatic disease.

(I have post-processed this exam in the DynaCAD application for
MR/US fusion-guided biopsy if performed.)

## 2023-01-23 ENCOUNTER — Other Ambulatory Visit: Payer: Self-pay | Admitting: Orthopedic Surgery

## 2023-01-23 DIAGNOSIS — M25512 Pain in left shoulder: Secondary | ICD-10-CM | POA: Diagnosis not present

## 2023-01-23 DIAGNOSIS — M19011 Primary osteoarthritis, right shoulder: Secondary | ICD-10-CM

## 2023-01-26 DIAGNOSIS — K080 Exfoliation of teeth due to systemic causes: Secondary | ICD-10-CM | POA: Diagnosis not present

## 2023-02-03 ENCOUNTER — Ambulatory Visit
Admission: RE | Admit: 2023-02-03 | Discharge: 2023-02-03 | Disposition: A | Discharge status: Home / Self Care | Payer: Medicare Other | Source: Ambulatory Visit | Attending: Orthopedic Surgery | Admitting: Orthopedic Surgery

## 2023-02-03 DIAGNOSIS — M25711 Osteophyte, right shoulder: Secondary | ICD-10-CM | POA: Diagnosis not present

## 2023-02-03 DIAGNOSIS — M19011 Primary osteoarthritis, right shoulder: Secondary | ICD-10-CM | POA: Diagnosis not present

## 2023-02-13 DIAGNOSIS — B078 Other viral warts: Secondary | ICD-10-CM | POA: Diagnosis not present

## 2023-02-13 DIAGNOSIS — L739 Follicular disorder, unspecified: Secondary | ICD-10-CM | POA: Diagnosis not present

## 2023-02-24 DIAGNOSIS — J45909 Unspecified asthma, uncomplicated: Secondary | ICD-10-CM | POA: Diagnosis not present

## 2023-02-24 DIAGNOSIS — Z01818 Encounter for other preprocedural examination: Secondary | ICD-10-CM | POA: Diagnosis not present

## 2023-02-24 DIAGNOSIS — M25519 Pain in unspecified shoulder: Secondary | ICD-10-CM | POA: Diagnosis not present

## 2023-03-15 DIAGNOSIS — L739 Follicular disorder, unspecified: Secondary | ICD-10-CM | POA: Diagnosis not present

## 2023-03-15 DIAGNOSIS — B078 Other viral warts: Secondary | ICD-10-CM | POA: Diagnosis not present

## 2023-04-07 DIAGNOSIS — M19011 Primary osteoarthritis, right shoulder: Secondary | ICD-10-CM | POA: Diagnosis not present

## 2023-04-07 DIAGNOSIS — G8918 Other acute postprocedural pain: Secondary | ICD-10-CM | POA: Diagnosis not present

## 2023-04-07 DIAGNOSIS — I890 Lymphedema, not elsewhere classified: Secondary | ICD-10-CM | POA: Diagnosis not present

## 2023-04-07 DIAGNOSIS — Z471 Aftercare following joint replacement surgery: Secondary | ICD-10-CM | POA: Diagnosis not present

## 2023-04-07 DIAGNOSIS — M25511 Pain in right shoulder: Secondary | ICD-10-CM | POA: Diagnosis not present

## 2023-04-24 DIAGNOSIS — Z96611 Presence of right artificial shoulder joint: Secondary | ICD-10-CM | POA: Diagnosis not present

## 2023-04-28 DIAGNOSIS — H59811 Chorioretinal scars after surgery for detachment, right eye: Secondary | ICD-10-CM | POA: Diagnosis not present

## 2023-04-28 DIAGNOSIS — H35033 Hypertensive retinopathy, bilateral: Secondary | ICD-10-CM | POA: Diagnosis not present

## 2023-04-28 DIAGNOSIS — H34831 Tributary (branch) retinal vein occlusion, right eye, with macular edema: Secondary | ICD-10-CM | POA: Diagnosis not present

## 2023-04-28 DIAGNOSIS — D3131 Benign neoplasm of right choroid: Secondary | ICD-10-CM | POA: Diagnosis not present

## 2023-04-28 DIAGNOSIS — H43822 Vitreomacular adhesion, left eye: Secondary | ICD-10-CM | POA: Diagnosis not present

## 2023-05-08 DIAGNOSIS — M25611 Stiffness of right shoulder, not elsewhere classified: Secondary | ICD-10-CM | POA: Diagnosis not present

## 2023-05-08 DIAGNOSIS — M25511 Pain in right shoulder: Secondary | ICD-10-CM | POA: Diagnosis not present

## 2023-05-10 DIAGNOSIS — M25511 Pain in right shoulder: Secondary | ICD-10-CM | POA: Diagnosis not present

## 2023-05-10 DIAGNOSIS — M25611 Stiffness of right shoulder, not elsewhere classified: Secondary | ICD-10-CM | POA: Diagnosis not present

## 2023-05-15 DIAGNOSIS — M25511 Pain in right shoulder: Secondary | ICD-10-CM | POA: Diagnosis not present

## 2023-05-15 DIAGNOSIS — M25611 Stiffness of right shoulder, not elsewhere classified: Secondary | ICD-10-CM | POA: Diagnosis not present

## 2023-05-18 DIAGNOSIS — M25511 Pain in right shoulder: Secondary | ICD-10-CM | POA: Diagnosis not present

## 2023-05-18 DIAGNOSIS — M25611 Stiffness of right shoulder, not elsewhere classified: Secondary | ICD-10-CM | POA: Diagnosis not present

## 2023-05-22 DIAGNOSIS — I10 Essential (primary) hypertension: Secondary | ICD-10-CM | POA: Diagnosis not present

## 2023-05-22 DIAGNOSIS — J45909 Unspecified asthma, uncomplicated: Secondary | ICD-10-CM | POA: Diagnosis not present

## 2023-05-22 DIAGNOSIS — I700 Atherosclerosis of aorta: Secondary | ICD-10-CM | POA: Diagnosis not present

## 2023-05-22 DIAGNOSIS — E782 Mixed hyperlipidemia: Secondary | ICD-10-CM | POA: Diagnosis not present

## 2023-05-22 DIAGNOSIS — M25611 Stiffness of right shoulder, not elsewhere classified: Secondary | ICD-10-CM | POA: Diagnosis not present

## 2023-05-22 DIAGNOSIS — M25511 Pain in right shoulder: Secondary | ICD-10-CM | POA: Diagnosis not present

## 2023-05-23 DIAGNOSIS — C61 Malignant neoplasm of prostate: Secondary | ICD-10-CM | POA: Diagnosis not present

## 2023-05-24 DIAGNOSIS — M25511 Pain in right shoulder: Secondary | ICD-10-CM | POA: Diagnosis not present

## 2023-05-24 DIAGNOSIS — M25611 Stiffness of right shoulder, not elsewhere classified: Secondary | ICD-10-CM | POA: Diagnosis not present

## 2023-05-29 DIAGNOSIS — M25611 Stiffness of right shoulder, not elsewhere classified: Secondary | ICD-10-CM | POA: Diagnosis not present

## 2023-05-29 DIAGNOSIS — M25511 Pain in right shoulder: Secondary | ICD-10-CM | POA: Diagnosis not present

## 2023-05-30 DIAGNOSIS — N400 Enlarged prostate without lower urinary tract symptoms: Secondary | ICD-10-CM | POA: Diagnosis not present

## 2023-05-30 DIAGNOSIS — C61 Malignant neoplasm of prostate: Secondary | ICD-10-CM | POA: Diagnosis not present

## 2023-06-01 DIAGNOSIS — M25511 Pain in right shoulder: Secondary | ICD-10-CM | POA: Diagnosis not present

## 2023-06-01 DIAGNOSIS — M25611 Stiffness of right shoulder, not elsewhere classified: Secondary | ICD-10-CM | POA: Diagnosis not present

## 2023-06-05 DIAGNOSIS — M25511 Pain in right shoulder: Secondary | ICD-10-CM | POA: Diagnosis not present

## 2023-06-05 DIAGNOSIS — M25611 Stiffness of right shoulder, not elsewhere classified: Secondary | ICD-10-CM | POA: Diagnosis not present

## 2023-06-07 DIAGNOSIS — M25611 Stiffness of right shoulder, not elsewhere classified: Secondary | ICD-10-CM | POA: Diagnosis not present

## 2023-06-07 DIAGNOSIS — M25511 Pain in right shoulder: Secondary | ICD-10-CM | POA: Diagnosis not present

## 2023-06-13 DIAGNOSIS — M25611 Stiffness of right shoulder, not elsewhere classified: Secondary | ICD-10-CM | POA: Diagnosis not present

## 2023-06-13 DIAGNOSIS — M25511 Pain in right shoulder: Secondary | ICD-10-CM | POA: Diagnosis not present

## 2023-06-19 DIAGNOSIS — M25611 Stiffness of right shoulder, not elsewhere classified: Secondary | ICD-10-CM | POA: Diagnosis not present

## 2023-06-19 DIAGNOSIS — M25511 Pain in right shoulder: Secondary | ICD-10-CM | POA: Diagnosis not present

## 2023-06-22 DIAGNOSIS — M25511 Pain in right shoulder: Secondary | ICD-10-CM | POA: Diagnosis not present

## 2023-06-22 DIAGNOSIS — M25611 Stiffness of right shoulder, not elsewhere classified: Secondary | ICD-10-CM | POA: Diagnosis not present

## 2023-06-26 DIAGNOSIS — M25611 Stiffness of right shoulder, not elsewhere classified: Secondary | ICD-10-CM | POA: Diagnosis not present

## 2023-06-26 DIAGNOSIS — M25511 Pain in right shoulder: Secondary | ICD-10-CM | POA: Diagnosis not present

## 2023-06-28 DIAGNOSIS — I10 Essential (primary) hypertension: Secondary | ICD-10-CM | POA: Diagnosis not present

## 2023-06-28 DIAGNOSIS — E782 Mixed hyperlipidemia: Secondary | ICD-10-CM | POA: Diagnosis not present

## 2023-06-30 DIAGNOSIS — M25511 Pain in right shoulder: Secondary | ICD-10-CM | POA: Diagnosis not present

## 2023-06-30 DIAGNOSIS — M25611 Stiffness of right shoulder, not elsewhere classified: Secondary | ICD-10-CM | POA: Diagnosis not present

## 2023-07-03 DIAGNOSIS — M25511 Pain in right shoulder: Secondary | ICD-10-CM | POA: Diagnosis not present

## 2023-07-03 DIAGNOSIS — M25611 Stiffness of right shoulder, not elsewhere classified: Secondary | ICD-10-CM | POA: Diagnosis not present

## 2023-07-05 ENCOUNTER — Other Ambulatory Visit: Payer: Self-pay | Admitting: Orthopedic Surgery

## 2023-07-05 DIAGNOSIS — M19012 Primary osteoarthritis, left shoulder: Secondary | ICD-10-CM

## 2023-07-05 DIAGNOSIS — Z96611 Presence of right artificial shoulder joint: Secondary | ICD-10-CM | POA: Diagnosis not present

## 2023-07-05 DIAGNOSIS — M19011 Primary osteoarthritis, right shoulder: Secondary | ICD-10-CM

## 2023-07-05 DIAGNOSIS — Z471 Aftercare following joint replacement surgery: Secondary | ICD-10-CM | POA: Diagnosis not present

## 2023-07-06 ENCOUNTER — Encounter: Payer: Self-pay | Admitting: Orthopedic Surgery

## 2023-07-07 ENCOUNTER — Other Ambulatory Visit: Payer: Medicare Other

## 2023-08-03 DIAGNOSIS — K080 Exfoliation of teeth due to systemic causes: Secondary | ICD-10-CM | POA: Diagnosis not present

## 2023-10-23 DIAGNOSIS — H59811 Chorioretinal scars after surgery for detachment, right eye: Secondary | ICD-10-CM | POA: Diagnosis not present

## 2023-10-23 DIAGNOSIS — H43822 Vitreomacular adhesion, left eye: Secondary | ICD-10-CM | POA: Diagnosis not present

## 2023-10-23 DIAGNOSIS — H35033 Hypertensive retinopathy, bilateral: Secondary | ICD-10-CM | POA: Diagnosis not present

## 2023-10-23 DIAGNOSIS — D3131 Benign neoplasm of right choroid: Secondary | ICD-10-CM | POA: Diagnosis not present

## 2023-10-23 DIAGNOSIS — H34831 Tributary (branch) retinal vein occlusion, right eye, with macular edema: Secondary | ICD-10-CM | POA: Diagnosis not present

## 2023-11-06 DIAGNOSIS — E782 Mixed hyperlipidemia: Secondary | ICD-10-CM | POA: Diagnosis not present

## 2023-11-06 DIAGNOSIS — I10 Essential (primary) hypertension: Secondary | ICD-10-CM | POA: Diagnosis not present

## 2023-11-06 DIAGNOSIS — J45909 Unspecified asthma, uncomplicated: Secondary | ICD-10-CM | POA: Diagnosis not present

## 2023-11-06 DIAGNOSIS — I700 Atherosclerosis of aorta: Secondary | ICD-10-CM | POA: Diagnosis not present

## 2023-11-06 DIAGNOSIS — R7303 Prediabetes: Secondary | ICD-10-CM | POA: Diagnosis not present

## 2023-11-17 ENCOUNTER — Ambulatory Visit
Admission: RE | Admit: 2023-11-17 | Discharge: 2023-11-17 | Disposition: A | Discharge status: Home / Self Care | Payer: Medicare Other | Source: Ambulatory Visit | Attending: Orthopedic Surgery | Admitting: Orthopedic Surgery

## 2023-11-17 DIAGNOSIS — M19012 Primary osteoarthritis, left shoulder: Secondary | ICD-10-CM | POA: Diagnosis not present

## 2023-11-17 DIAGNOSIS — M25512 Pain in left shoulder: Secondary | ICD-10-CM | POA: Diagnosis not present

## 2023-12-01 ENCOUNTER — Emergency Department (HOSPITAL_COMMUNITY): Payer: Medicare Other

## 2023-12-01 ENCOUNTER — Inpatient Hospital Stay (HOSPITAL_COMMUNITY)
Admission: EM | Admit: 2023-12-01 | Discharge: 2023-12-03 | DRG: 205 | Disposition: A | Discharge status: Home / Self Care | Payer: Medicare Other | Attending: Internal Medicine | Admitting: Internal Medicine

## 2023-12-01 DIAGNOSIS — D649 Anemia, unspecified: Secondary | ICD-10-CM | POA: Diagnosis not present

## 2023-12-01 DIAGNOSIS — M25512 Pain in left shoulder: Secondary | ICD-10-CM | POA: Diagnosis present

## 2023-12-01 DIAGNOSIS — Z91041 Radiographic dye allergy status: Secondary | ICD-10-CM | POA: Diagnosis not present

## 2023-12-01 DIAGNOSIS — M19012 Primary osteoarthritis, left shoulder: Secondary | ICD-10-CM | POA: Diagnosis not present

## 2023-12-01 DIAGNOSIS — J189 Pneumonia, unspecified organism: Secondary | ICD-10-CM | POA: Diagnosis not present

## 2023-12-01 DIAGNOSIS — G8929 Other chronic pain: Secondary | ICD-10-CM | POA: Diagnosis not present

## 2023-12-01 DIAGNOSIS — M25519 Pain in unspecified shoulder: Secondary | ICD-10-CM | POA: Diagnosis not present

## 2023-12-01 DIAGNOSIS — I700 Atherosclerosis of aorta: Secondary | ICD-10-CM | POA: Diagnosis present

## 2023-12-01 DIAGNOSIS — Z87891 Personal history of nicotine dependence: Secondary | ICD-10-CM | POA: Diagnosis not present

## 2023-12-01 DIAGNOSIS — I4891 Unspecified atrial fibrillation: Secondary | ICD-10-CM | POA: Diagnosis not present

## 2023-12-01 DIAGNOSIS — J45909 Unspecified asthma, uncomplicated: Secondary | ICD-10-CM | POA: Diagnosis not present

## 2023-12-01 DIAGNOSIS — E66812 Obesity, class 2: Secondary | ICD-10-CM | POA: Diagnosis present

## 2023-12-01 DIAGNOSIS — Z96653 Presence of artificial knee joint, bilateral: Secondary | ICD-10-CM | POA: Diagnosis not present

## 2023-12-01 DIAGNOSIS — R739 Hyperglycemia, unspecified: Secondary | ICD-10-CM | POA: Diagnosis not present

## 2023-12-01 DIAGNOSIS — Z96611 Presence of right artificial shoulder joint: Secondary | ICD-10-CM | POA: Diagnosis present

## 2023-12-01 DIAGNOSIS — I119 Hypertensive heart disease without heart failure: Secondary | ICD-10-CM | POA: Diagnosis present

## 2023-12-01 DIAGNOSIS — Z96612 Presence of left artificial shoulder joint: Secondary | ICD-10-CM | POA: Diagnosis present

## 2023-12-01 DIAGNOSIS — E781 Pure hyperglyceridemia: Secondary | ICD-10-CM | POA: Diagnosis present

## 2023-12-01 DIAGNOSIS — J9601 Acute respiratory failure with hypoxia: Principal | ICD-10-CM | POA: Diagnosis present

## 2023-12-01 DIAGNOSIS — I440 Atrioventricular block, first degree: Secondary | ICD-10-CM | POA: Diagnosis not present

## 2023-12-01 DIAGNOSIS — J9589 Other postprocedural complications and disorders of respiratory system, not elsewhere classified: Principal | ICD-10-CM | POA: Diagnosis present

## 2023-12-01 DIAGNOSIS — Z6835 Body mass index (BMI) 35.0-35.9, adult: Secondary | ICD-10-CM | POA: Diagnosis not present

## 2023-12-01 DIAGNOSIS — M25712 Osteophyte, left shoulder: Secondary | ICD-10-CM | POA: Diagnosis not present

## 2023-12-01 DIAGNOSIS — R7303 Prediabetes: Secondary | ICD-10-CM | POA: Diagnosis present

## 2023-12-01 DIAGNOSIS — I499 Cardiac arrhythmia, unspecified: Secondary | ICD-10-CM | POA: Diagnosis not present

## 2023-12-01 DIAGNOSIS — Z885 Allergy status to narcotic agent status: Secondary | ICD-10-CM | POA: Diagnosis not present

## 2023-12-01 DIAGNOSIS — Z79899 Other long term (current) drug therapy: Secondary | ICD-10-CM | POA: Diagnosis not present

## 2023-12-01 DIAGNOSIS — E7800 Pure hypercholesterolemia, unspecified: Secondary | ICD-10-CM | POA: Diagnosis not present

## 2023-12-01 DIAGNOSIS — K8020 Calculus of gallbladder without cholecystitis without obstruction: Secondary | ICD-10-CM | POA: Diagnosis not present

## 2023-12-01 DIAGNOSIS — R918 Other nonspecific abnormal finding of lung field: Secondary | ICD-10-CM | POA: Diagnosis not present

## 2023-12-01 DIAGNOSIS — Z880 Allergy status to penicillin: Secondary | ICD-10-CM | POA: Diagnosis not present

## 2023-12-01 DIAGNOSIS — Z471 Aftercare following joint replacement surgery: Secondary | ICD-10-CM | POA: Diagnosis not present

## 2023-12-01 DIAGNOSIS — J690 Pneumonitis due to inhalation of food and vomit: Secondary | ICD-10-CM | POA: Diagnosis present

## 2023-12-01 DIAGNOSIS — I4430 Unspecified atrioventricular block: Secondary | ICD-10-CM | POA: Diagnosis not present

## 2023-12-01 DIAGNOSIS — R079 Chest pain, unspecified: Principal | ICD-10-CM

## 2023-12-01 DIAGNOSIS — J984 Other disorders of lung: Secondary | ICD-10-CM | POA: Diagnosis not present

## 2023-12-01 DIAGNOSIS — R0789 Other chest pain: Secondary | ICD-10-CM | POA: Diagnosis not present

## 2023-12-01 DIAGNOSIS — J9811 Atelectasis: Secondary | ICD-10-CM | POA: Diagnosis not present

## 2023-12-01 DIAGNOSIS — G8918 Other acute postprocedural pain: Secondary | ICD-10-CM | POA: Diagnosis not present

## 2023-12-01 LAB — CREATININE, SERUM
Creatinine, Ser: 1.21 mg/dL (ref 0.61–1.24)
GFR, Estimated: >60 mL/min (ref >60)

## 2023-12-01 LAB — CBC
HCT: 39.4 % (ref 39.0–52.0)
Hemoglobin: 13.5 g/dL (ref 13.0–17.0)
MCH: 31.1 pg (ref 26.0–34.0)
MCHC: 34.3 g/dL (ref 30.0–36.0)
MCV: 90.8 fL (ref 80.0–100.0)
Platelets: 163 10*3/uL (ref 150–400)
RBC: 4.34 MIL/uL (ref 4.22–5.81)
RDW: 13.2 % (ref 11.5–15.5)
WBC: 13.3 10*3/uL — ABNORMAL HIGH (ref 4.0–10.5)
nRBC: 0 % (ref 0.0–0.2)

## 2023-12-01 LAB — BASIC METABOLIC PANEL
Anion gap: 13 (ref 5–15)
BUN: 22 mg/dL (ref 8–23)
CO2: 23 mmol/L (ref 22–32)
Calcium: 9.1 mg/dL (ref 8.9–10.3)
Chloride: 98 mmol/L (ref 98–111)
Creatinine, Ser: 1.21 mg/dL (ref 0.61–1.24)
GFR, Estimated: >60 mL/min (ref >60)
Glucose, Bld: 194 mg/dL — ABNORMAL HIGH (ref 70–99)
Potassium: 3.9 mmol/L (ref 3.5–5.1)
Sodium: 134 mmol/L — ABNORMAL LOW (ref 135–145)

## 2023-12-01 LAB — I-STAT CHEM 8, ED
BUN: 25 mg/dL — ABNORMAL HIGH (ref 8–23)
Calcium, Ion: 1.07 mmol/L — ABNORMAL LOW (ref 1.15–1.40)
Chloride: 103 mmol/L (ref 98–111)
Creatinine, Ser: 1.2 mg/dL (ref 0.61–1.24)
Glucose, Bld: 198 mg/dL — ABNORMAL HIGH (ref 70–99)
HCT: 38 % — ABNORMAL LOW (ref 39.0–52.0)
Hemoglobin: 12.9 g/dL — ABNORMAL LOW (ref 13.0–17.0)
Potassium: 4 mmol/L (ref 3.5–5.1)
Sodium: 137 mmol/L (ref 135–145)
TCO2: 23 mmol/L (ref 22–32)

## 2023-12-01 LAB — TROPONIN I (HIGH SENSITIVITY)
Troponin I (High Sensitivity): 12 ng/L (ref <18)
Troponin I (High Sensitivity): 28 ng/L — ABNORMAL HIGH (ref <18)
Troponin I (High Sensitivity): 48 ng/L — ABNORMAL HIGH (ref <18)

## 2023-12-01 LAB — BRAIN NATRIURETIC PEPTIDE: B Natriuretic Peptide: 51.5 pg/mL (ref 0.0–100.0)

## 2023-12-01 MED ORDER — IOHEXOL 350 MG/ML SOLN
75.0000 mL | Freq: Once | INTRAVENOUS | Status: AC | PRN
  Administered 2023-12-01: 75 mL via INTRAVENOUS

## 2023-12-01 MED ORDER — IPRATROPIUM-ALBUTEROL 0.5-2.5 (3) MG/3ML IN SOLN
3.0000 mL | Freq: Four times a day (QID) | RESPIRATORY_TRACT | Status: DC
  Administered 2023-12-01: 3 mL via RESPIRATORY_TRACT
  Filled 2023-12-01 (×3): qty 3

## 2023-12-01 MED ORDER — METHYLPREDNISOLONE SODIUM SUCC 40 MG IJ SOLR
40.0000 mg | Freq: Once | INTRAMUSCULAR | Status: AC
  Administered 2023-12-01: 40 mg via INTRAVENOUS
  Filled 2023-12-01: qty 1

## 2023-12-01 MED ORDER — INSULIN ASPART 100 UNIT/ML IJ SOLN: 0.0000 [IU] | Freq: Three times a day (TID) | INTRAMUSCULAR | Status: DC

## 2023-12-01 MED ORDER — GUAIFENESIN-DM 100-10 MG/5ML PO SYRP: 5.0000 mL | ORAL_SOLUTION | ORAL | Status: DC | PRN

## 2023-12-01 MED ORDER — POLYETHYLENE GLYCOL 3350 17 G PO PACK: 17.0000 g | PACK | Freq: Every day | ORAL | Status: DC | PRN

## 2023-12-01 MED ORDER — SODIUM CHLORIDE 0.9 % IV SOLN
2.0000 g | Freq: Once | INTRAVENOUS | Status: AC
  Administered 2023-12-01: 2 g via INTRAVENOUS
  Filled 2023-12-01: qty 20

## 2023-12-01 MED ORDER — DIPHENHYDRAMINE HCL 50 MG/ML IJ SOLN
50.0000 mg | Freq: Once | INTRAMUSCULAR | Status: AC
  Filled 2023-12-01: qty 1

## 2023-12-01 MED ORDER — INSULIN ASPART 100 UNIT/ML IJ SOLN: 0.0000 [IU] | Freq: Every day | INTRAMUSCULAR | Status: DC

## 2023-12-01 MED ORDER — ENOXAPARIN SODIUM 40 MG/0.4ML IJ SOSY
40.0000 mg | PREFILLED_SYRINGE | INTRAMUSCULAR | Status: DC
Start: 2023-12-01 — End: 2023-12-03
  Administered 2023-12-01 – 2023-12-02 (×2): 40 mg via SUBCUTANEOUS
  Filled 2023-12-01 (×2): qty 0.4

## 2023-12-01 MED ORDER — PROCHLORPERAZINE EDISYLATE 10 MG/2ML IJ SOLN: 5.0000 mg | Freq: Four times a day (QID) | INTRAMUSCULAR | Status: DC | PRN

## 2023-12-01 MED ORDER — DIPHENHYDRAMINE HCL 25 MG PO CAPS
50.0000 mg | ORAL_CAPSULE | Freq: Once | ORAL | Status: AC
  Administered 2023-12-01: 50 mg via ORAL
  Filled 2023-12-01: qty 2

## 2023-12-01 MED ORDER — ACETAMINOPHEN 325 MG PO TABS
650.0000 mg | ORAL_TABLET | Freq: Four times a day (QID) | ORAL | Status: DC | PRN
  Administered 2023-12-03: 650 mg via ORAL
  Filled 2023-12-01: qty 2

## 2023-12-01 MED ORDER — MELATONIN 5 MG PO TABS: 5.0000 mg | ORAL_TABLET | Freq: Every evening | ORAL | Status: DC | PRN

## 2023-12-01 NOTE — ED Provider Notes (Signed)
Medical Decision Making: Care of patient assumed from Riki Sheer, PA-C at 2:59 PM.  Agree with history.  See their note for further details.  Briefly, 73 y.o. male with PMH/PSH as below.  Past Medical History:  Diagnosis Date   Arthritis    Asthma    HTN (hypertension)    Hypercholesteremia    Hypertriglyceridemia    Past Surgical History:  Procedure Laterality Date   COLONOSCOPY     CYST EXCISION     KNEE ARTHROSCOPY     SHOULDER ARTHROSCOPY Bilateral    TOTAL KNEE ARTHROPLASTY Left 12/09/2021   Procedure: TOTAL KNEE ARTHROPLASTY;  Surgeon: Durene Romans, MD;  Location: [facility];  Service: Orthopedics;  Laterality: Left;   TOTAL KNEE ARTHROPLASTY Right 02/03/2022   Procedure: TOTAL KNEE ARTHROPLASTY;  Surgeon: Durene Romans, MD;  Location: [facility];  Service: Orthopedics;  Laterality: Right;      Patient presents with acute onset of chest pain, dyspnea, diaphoresis while in the PACU after undergoing a left shoulder replacement this morning.  Initially hypoxic to the high 80s so was placed on 2 L [place].  He received 324 mg aspirin, sublingual nitroglycerin en route.  EKG  Current plan is as follows:  Clinical Course as of 12/02/23 0009  Fri Dec 01, 2023  1458 Hx htn, hld, pre-DM  L shoulder replacement earlier today L chest pain when waking from anesthesia, clammy, diaphoretic, spo2 high 80s > 2L [place] EKG with ST [ ]  f/u labs, CT PE  [GG]  1539 Patient has contrast allergy.  Ordered contrast premeds per protocol.  Nursing advised for timing. [JR]  2022 Spoke with Dow Adolph, Hospitalist [GG]  2217 Recommend TTE, probably type 2 Only cards eval if still chest pain after tx pna OP follow up reasonable Dr. Hyacinth Meeker   [GG]    Clinical Course User Index [GG] Mikeal Hawthorne, MD [JR] Gareth Eagle, PA-C    MDM:  -Reviewed and confirmed nursing documentation for past medical history, family history, social history. -Vital signs stable. -Upon reevaluation, patient overall  well-appearing, reporting improvement of his prior chest pain. -Initial labs within normal limits, however patient subsequently with positive delta troponin up to 28 from initial level of 10 -CT PE study was ordered and did not demonstrate any PE, though did note left lower lobe pneumonia, so he was given a dose of Rocephin.  Suspect he likely had aspiration event -Spoke with cardiology Dr. Hyacinth Meeker who feels likely type II NSTEMI in the setting of pneumonia, recommending medical management of pneumonia with formal cardiology consult if patient has persistent chest pain despite appropriate pneumonia treatment or abnormal findings on formal TTE.  Otherwise he recommends cardiology referral on discharge -Patient requires admission for further evaluation and management of pneumonia with new oxygen requirement, as well as likely type II NSTEMI -He will be admitted to the hospitalist service, please see their notes for further treatment plan details -Patient had no acute events while under my care in the emergency department.     The plan for this patient was discussed with Dr. Theresia Lo, who voiced agreement and who oversaw evaluation and treatment of this patient.  Mikeal Hawthorne, MD Emergency Medicine, PGY-2  Note: Dragon medical dictation software was used in the creation of this note.   ED disposition: Anders Simmonds, MD 12/02/23 0015    Rexford Maus, DO 12/02/23 2020

## 2023-12-01 NOTE — H&P (Incomplete)
History and Physical  [name]:[id number] DOB: [birth year] DOA: 12/01/2023  Referring physician: Dr. Rolland Bimler, Resident-EDP  PCP: Joycelyn Rua, MD  Outpatient Specialists: Orthopedic surgery. Patient coming from: Home.  Chief Complaint: Chest pain.  HPI: Leonard Burke is a 73 y.o. male with medical history significant for prediabetes, hypertension, hyperlipidemia, chronic left shoulder pain status post left reverse shoulder arthroplasty, aortic atherosclerosis, asthma, who presented to the ER, 2-hour postop, with complaints of chest pain and shortness of breath.  Associated with generalized weakness for weeks.  In the ER, afebrile, with leukocytosis.  No evidence of acute ischemia on twelve-lead EKG.  High-sensitivity troponin uptrending to 28 from 12.  Chest x-ray revealed left lower lobe infiltrates with an enlarged cardiac silhouette.  The patient was noted to be hypoxic with O2 saturation in the 80s which prompted a CT angio chest PE with and without contrast study.  This was negative for pulmonary embolism however showed left lower lobe infiltrates with concern for pneumonia.  Empiric IV antibiotics were initiated in the ED, Rocephin.  EDP discussed the case with cardiology who will see in consultation.  The patient received a full dose aspirin in the ED.  TR[facility], hospitalist service, was asked to admit.  ED Course: Temperature 97.5.  BP 148/77, pulse 88, respiration 18, saturation 97% on 2 L nasal cannula.  Lab studies notable for WBC 13.3, hemoglobin 13.5.  Platelet count 163.  Serum glucose 198, BUN 25, creatinine 1.20, GFR greater than 60.  BNP 51.5.  Troponin 12, 28.  Review of Systems: Review of systems as noted in the HPI. All other systems reviewed and are negative.   Past Medical History:  Diagnosis Date   Arthritis    Asthma    HTN (hypertension)    Hypercholesteremia    Hypertriglyceridemia    Past Surgical History:  Procedure Laterality Date    COLONOSCOPY     CYST EXCISION     KNEE ARTHROSCOPY     SHOULDER ARTHROSCOPY Bilateral    TOTAL KNEE ARTHROPLASTY Left 12/09/2021   Procedure: TOTAL KNEE ARTHROPLASTY;  Surgeon: Durene Romans, MD;  Location: [facility];  Service: Orthopedics;  Laterality: Left;   TOTAL KNEE ARTHROPLASTY Right 02/03/2022   Procedure: TOTAL KNEE ARTHROPLASTY;  Surgeon: Durene Romans, MD;  Location: [facility];  Service: Orthopedics;  Laterality: Right;    Social History:  reports that he has quit smoking. His smoking use included cigarettes. He has never used smokeless tobacco. He reports that he does not currently use alcohol. He reports that he does not use drugs.   Allergies  Allergen Reactions   Codeine Nausea And Vomiting   Penicillins Rash    Tolerated Cephalosporin 12/09/21     Ivp Dye [Iodinated Contrast Media] Hives   Multihance [Gadobenate] Hives and Itching    Per MRI MD note; would need 13hr prep    Family History  Problem Relation Age of Onset   Other Neg Hx        hypertriglyceridemia      Prior to Admission medications   Medication Sig Start Date End Date Taking? Authorizing Provider  acetaminophen (TYLENOL) 325 MG tablet Take 3 tablets (975 mg total) by mouth every 6 (six) hours. 12/10/21   Cassandria Anger, PA-C  amLODipine (NORVASC) 10 MG tablet Take 10 mg by mouth daily. 02/23/14   [provider]  atenolol (TENORMIN) 50 MG tablet Take 1 tablet (50 mg total) by mouth daily. 12/10/21   Cassandria Anger, PA-C  diphenhydrAMINE (BENADRYL) 50 MG tablet Take 1 tablet (50 mg total) by mouth once for 1 dose. 08/29/22 08/29/22  Obie Dredge, MD  docusate sodium (COLACE) 100 MG capsule Take 1 capsule (100 mg total) by mouth 2 (two) times daily. 12/10/21   Cassandria Anger, PA-C  fenofibrate (TRICOR) 145 MG tablet Take 1 tablet (145 mg total) by mouth daily. 01/25/18   Romero Belling, MD  HYDROmorphone (DILAUDID) 2 MG tablet Take 1-2 tablets (2-4 mg total) by mouth every 4 (four) hours as  needed for severe pain. 02/04/22   Cassandria Anger, PA-C  methocarbamol (ROBAXIN) 500 MG tablet Take 1 tablet (500 mg total) by mouth every 6 (six) hours as needed for muscle spasms. 02/04/22   Cassandria Anger, PA-C  Naphazoline-Polyethyl Glycol (EYE DROPS) 0.012-0.2 % SOLN Place 1 drop into both eyes daily as needed (Allergies/dry eyes).    [provider]  olmesartan (BENICAR) 20 MG tablet Take 1 tablet (20 mg total) by mouth daily. 11/16/17   Romero Belling, MD  polyethylene glycol (MIRALAX / GLYCOLAX) 17 g packet Take 17 g by mouth daily as needed for mild constipation. 12/10/21   Cassandria Anger, PA-C  predniSONE (DELTASONE) 50 MG tablet Pt to take 50 mg of prednisone on 09/11/22 at 7:00PM, 50 mg of prednisone on 09/12/22 at 1:00 AM, and 50 mg of prednisone on 09/12/22 at 7:00 AM. Pt is also to take 50 mg of benadryl on 09/12/22 at 7:00 AM. Please call [phone] with any questions. 08/29/22   Obie Dredge, MD  rosuvastatin (CRESTOR) 5 MG tablet Take 5 mg by mouth daily. 05/27/21   [provider]  VENTOLIN HFA 108 (90 Base) MCG/ACT inhaler Inhale 90 mcg into the lungs every 4 (four) hours as needed for wheezing or shortness of breath. 09/25/17   [provider]  zolpidem (AMBIEN) 10 MG tablet Take 10 mg by mouth daily as needed for sleep. 12/31/20   [provider]    Physical Exam: BP (!) 148/77   Pulse 88   Temp (!) 97.5 F (36.4 C)   Resp 18   SpO2 97%   General: 73 y.o. year-old male well developed well nourished in no acute distress.  Alert and oriented x3. Cardiovascular: Regular rate and rhythm with no rubs or gallops.  No thyromegaly or JVD noted.  No lower extremity edema. 2/4 pulses in all 4 extremities. Respiratory: Clear to auscultation with no wheezes or rales. Good inspiratory effort. Abdomen: Soft nontender nondistended with normal bowel sounds x4 quadrants. Muskuloskeletal: No cyanosis, clubbing or edema noted bilaterally Neuro: CN  II-XII intact, strength, sensation, reflexes Skin: No ulcerative lesions noted or rashes Psychiatry: Judgement and insight appear normal. Mood is appropriate for condition and setting          Labs on Admission:  Basic Metabolic Panel: Recent Labs  Lab 12/01/23 1419 12/01/23 1439  NA 134* 137  K 3.9 4.0  CL 98 103  CO2 23  --   GLUCOSE 194* 198*  BUN 22 25*  CREATININE 1.21 1.20  CALCIUM 9.1  --    Liver Function Tests: No results for input(s): "AST", "ALT", "ALKPHOS", "BILITOT", "PROT", "ALBUMIN" in the last 168 hours. No results for input(s): "LIPASE", "AMYLASE" in the last 168 hours. No results for input(s): "AMMONIA" in the last 168 hours. CBC: Recent Labs  Lab 12/01/23 1420 12/01/23 1439  WBC 13.3*  --   HGB 13.5 12.9*  HCT 39.4 38.0*  MCV 90.8  --  PLT 163  --    Cardiac Enzymes: No results for input(s): "CKTOTAL", "CKMB", "CKMBINDEX", "TROPONINI" in the last 168 hours.  BNP (last 3 results) Recent Labs    12/01/23 1419  BNP 51.5    ProBNP (last 3 results) No results for input(s): "PROBNP" in the last 8760 hours.  CBG: No results for input(s): "GLUCAP" in the last 168 hours.  Radiological Exams on Admission: CT Angio Chest Pulmonary Embolism (PE) W or WO Contrast Result Date: 12/01/2023 CLINICAL DATA:  High probability for PE. EXAM: CT ANGIOGRAPHY CHEST WITH CONTRAST TECHNIQUE: Multidetector CT imaging of the chest was performed using the standard protocol during bolus administration of intravenous contrast. Multiplanar CT image reconstructions and MIPs were obtained to evaluate the vascular anatomy. RADIATION DOSE REDUCTION: This exam was performed according to the departmental dose-optimization program which includes automated exposure control, adjustment of the mA and/or kV according to patient size and/or use of iterative reconstruction technique. CONTRAST:  75mL OMNIPAQUE IOHEXOL 350 MG/ML SOLN COMPARISON:  None Available. FINDINGS: Cardiovascular:  Satisfactory opacification of the pulmonary arteries to the segmental level. No evidence of pulmonary embolism. Normal heart size. No pericardial effusion. There are atherosclerotic calcifications of the aorta and coronary arteries. Mediastinum/Nodes: No enlarged mediastinal, hilar, or axillary lymph nodes. Thyroid gland, trachea, and esophagus demonstrate no significant findings. Lungs/Pleura: There is left lower lobe airspace disease/consolidation. There are bands of atelectasis in the bilateral upper lobes, right middle lobe and right lower lobe. Trachea and central airways are patent. There is no pleural effusion or pneumothorax. Upper Abdomen: Small gallstones are present. Musculoskeletal: Recent left shoulder arthroplasty changes are present. There is soft tissue swelling and air surrounding the left shoulder. Right shoulder arthroplasty is also present. No acute fractures are seen. Review of the MIP images confirms the above findings. IMPRESSION: 1. No evidence for pulmonary embolism. 2. Left lower lobe airspace disease/consolidation worrisome for pneumonia. 3. Bilateral bands of atelectasis. 4. Cholelithiasis. 5. Recent left shoulder arthroplasty changes with soft tissue swelling and air surrounding the left shoulder. 6. Aortic atherosclerosis. Aortic Atherosclerosis (ICD10-I70.0). Electronically Signed   By: Darliss Cheney M.D.   On: 12/01/2023 19:26   DG Chest Port 1 View Result Date: 12/01/2023 CLINICAL DATA:  Chest pain shoulder replacement EXAM: PORTABLE CHEST 1 VIEW COMPARISON:  None Available. FINDINGS: Partially visualized left shoulder replacement with gas in the soft tissues. Hypoventilatory changes with probable atelectasis at the left base. Cannot exclude small left effusion. Cardiac silhouette is enlarged. There is no pneumothorax. Possible scarring or atelectasis at the left apex. IMPRESSION: 1. Hypoventilatory changes with probable atelectasis at the left base. Cannot exclude small left  effusion. 2. Enlarged cardiac silhouette. 3. Question atelectasis or scarring at left apex, attention on follow-up chest radiography Electronically Signed   By: Jasmine Pang M.D.   On: 12/01/2023 15:12    EKG: I independently viewed the EKG done and my findings are as followed: Sinus tachycardia rate of 116.  Nonspecific ST-T changes.  QTc 546.  Left ventricular hypertrophy.  Assessment/Plan Present on Admission:  Acute hypoxic respiratory failure (HCC)  Principal Problem:   Acute hypoxic respiratory failure (HCC)  Acute hypoxic respiratory failure secondary to left lower lobe pneumonia, POA Presented with O2 saturation in the 80s, improved on 2 L nasal cannula to the mid 90s. Maintain O2 saturation above 92% Continue IV antibiotics initiated in the ED Rocephin. Pulmonary toilet Bronchodilators, antitussives as needed Incentive spirometer Early mobilization.  Chest pain rule out ACS Elevated troponin, suspect demand  ischemia in the setting of acute hypoxia. Received a full dose aspirin in the ED High sensitive troponin 28 from 12. Cycle troponin until it reaches its peak. Follow 2D echo, fasting lipid panel, A1c Closely monitor on telemetry Cardiology consulted by EDP.  QTc prolongation Admission twelve-lead EKG with QTc 546 Avoid QTc prolonging agents Closely monitor on telemetry Optimize magnesium and potassium levels.  Prediabetes with hyperglycemia Obtain hemoglobin A1c Heart healthy carb modified diet Start insulin sliding scale.  Hyperlipidemia Resume home regimen.  Asthma Resume home regimen.  Hypertension BPs are currently at goal. Resume home oral antihypertensives as blood pressure tolerates. Closely monitor vital signs.  Postop #0 status post left reverse shoulder arthroplasty Pain control PT OT assessment Fall precautions.  Generalized weakness PT OT assessment Fall precautions.   Critical care time: 65 minutes.   DVT prophylaxis: Subcu  Lovenox daily.  Code Status: Full code.  Family Communication: Updated the patient's daughter at bedside.  Disposition Plan: Admitted to telemetry cardiac unit.  Consults called: Cardiology consulted by EDP.  Admission status: Inpatient status.   Status is: Inpatient The patient requires at least 2 midnights for further evaluation and treatment of present condition.   Darlin Drop MD [facility] Pager [phone]  If 7PM-7AM, please contact night-coverage [email or web address] Password [email or web address]  12/01/2023, 8:30 PM

## 2023-12-01 NOTE — Progress Notes (Signed)
ED provider called to have me review the chart given patient having some chest discomfort and troponin elevation from 10 -> 28. Recent shoulder surgery and CT imaging suggesting of consolidative PNA. Being admitted to hospital medicine for management.   Recommend getting an echo for evaluation. No need for treatment for elevated troponin unless he is having chest pain/angina after treatment of PNA.   Please consult cardiology if worsening symptoms or abnormalities on echo. Reasonable to have outpatient cardiology consultation if medicine team feels needed.  Gerre Scull MD  Cardiology

## 2023-12-01 NOTE — ED Provider Notes (Signed)
[place] EMERGENCY DEPARTMENT AT [facility] Provider Note   CSN: [id number] Arrival date & time: 12/01/23  1323     History  Chief Complaint  Patient presents with   Chest Pain    a   HPI Leonard Burke is a 73 y.o. male with history of hypertension, hypertriglyceridemia, and asthma presenting for chest pain.  Started about 3 hours ago.  Patient underwent total left shoulder replacement this morning and about an hour after the procedure he started to experience chest pain.  Located in the left chest and radiates to the back.  States that overall chest pain has improved significantly.  Endorse associated shortness of breath.  EMS was called he was noted to be cool clammy and diaphoretic.  Also noted to be hypoxic into the high 80s.  O2 sats improved to the low 90s on 2 L of oxygen.  Also was given sublingual nitroglycerin and aspirin and chest pain did improve after treatment.  Denies history of blood clots, lower extremity edema, calf tenderness or recent immobilization.   Chest Pain      Home Medications Prior to Admission medications   Medication Sig Start Date End Date Taking? Authorizing Provider  acetaminophen (TYLENOL) 325 MG tablet Take 3 tablets (975 mg total) by mouth every 6 (six) hours. 12/10/21   Cassandria Anger, PA-C  amLODipine (NORVASC) 10 MG tablet Take 10 mg by mouth daily. 02/23/14   [provider]  atenolol (TENORMIN) 50 MG tablet Take 1 tablet (50 mg total) by mouth daily. 12/10/21   Cassandria Anger, PA-C  diphenhydrAMINE (BENADRYL) 50 MG tablet Take 1 tablet (50 mg total) by mouth once for 1 dose. 08/29/22 08/29/22  Obie Dredge, MD  docusate sodium (COLACE) 100 MG capsule Take 1 capsule (100 mg total) by mouth 2 (two) times daily. 12/10/21   Cassandria Anger, PA-C  fenofibrate (TRICOR) 145 MG tablet Take 1 tablet (145 mg total) by mouth daily. 01/25/18   Romero Belling, MD  HYDROmorphone (DILAUDID) 2 MG tablet Take 1-2 tablets (2-4 mg  total) by mouth every 4 (four) hours as needed for severe pain. 02/04/22   Cassandria Anger, PA-C  methocarbamol (ROBAXIN) 500 MG tablet Take 1 tablet (500 mg total) by mouth every 6 (six) hours as needed for muscle spasms. 02/04/22   Cassandria Anger, PA-C  Naphazoline-Polyethyl Glycol (EYE DROPS) 0.012-0.2 % SOLN Place 1 drop into both eyes daily as needed (Allergies/dry eyes).    [provider]  olmesartan (BENICAR) 20 MG tablet Take 1 tablet (20 mg total) by mouth daily. 11/16/17   Romero Belling, MD  polyethylene glycol (MIRALAX / GLYCOLAX) 17 g packet Take 17 g by mouth daily as needed for mild constipation. 12/10/21   Cassandria Anger, PA-C  predniSONE (DELTASONE) 50 MG tablet Pt to take 50 mg of prednisone on 09/11/22 at 7:00PM, 50 mg of prednisone on 09/12/22 at 1:00 AM, and 50 mg of prednisone on 09/12/22 at 7:00 AM. Pt is also to take 50 mg of benadryl on 09/12/22 at 7:00 AM. Please call [phone] with any questions. 08/29/22   Obie Dredge, MD  rosuvastatin (CRESTOR) 5 MG tablet Take 5 mg by mouth daily. 05/27/21   [provider]  VENTOLIN HFA 108 (90 Base) MCG/ACT inhaler Inhale 90 mcg into the lungs every 4 (four) hours as needed for wheezing or shortness of breath. 09/25/17   [provider]  zolpidem (AMBIEN) 10 MG tablet Take 10  mg by mouth daily as needed for sleep. 12/31/20   [provider]      Allergies    Codeine, Penicillins, Ivp dye [iodinated contrast media], and Multihance [gadobenate]    Review of Systems   Review of Systems  Cardiovascular:  Positive for chest pain.    Physical Exam Updated Vital Signs BP 126/88   Pulse (!) 108   Resp 15   SpO2 96%  Physical Exam  ED Results / Procedures / Treatments   Labs (all labs ordered are listed, but only abnormal results are displayed) Labs Reviewed  CBC - Abnormal; Notable for the following components:      Result Value   WBC 13.3 (*)    All other components within normal  limits  I-STAT CHEM 8, ED - Abnormal; Notable for the following components:   BUN 25 (*)    Glucose, Bld 198 (*)    Calcium, Ion 1.07 (*)    Hemoglobin 12.9 (*)    HCT 38.0 (*)    All other components within normal limits  BRAIN NATRIURETIC PEPTIDE  BASIC METABOLIC PANEL  TROPONIN I (HIGH SENSITIVITY)  TROPONIN I (HIGH SENSITIVITY)    EKG EKG Interpretation Date/Time:  Friday December 01 2023 13:30:20 EST Ventricular Rate:  116 PR Interval:    QRS Duration:  123 QT Interval:  393 QTC Calculation: 546 R Axis:   -65  Text Interpretation: Sinus tachycardia Left ventricular hypertrophy Left anterior fasicular block Confirmed by Cathren Laine ([id number]) on 12/01/2023 1:42:32 PM  Radiology DG Chest Port 1 View Result Date: 12/01/2023 CLINICAL DATA:  Chest pain shoulder replacement EXAM: PORTABLE CHEST 1 VIEW COMPARISON:  None Available. FINDINGS: Partially visualized left shoulder replacement with gas in the soft tissues. Hypoventilatory changes with probable atelectasis at the left base. Cannot exclude small left effusion. Cardiac silhouette is enlarged. There is no pneumothorax. Possible scarring or atelectasis at the left apex. IMPRESSION: 1. Hypoventilatory changes with probable atelectasis at the left base. Cannot exclude small left effusion. 2. Enlarged cardiac silhouette. 3. Question atelectasis or scarring at left apex, attention on follow-up chest radiography Electronically Signed   By: Jasmine Pang M.D.   On: 12/01/2023 15:12    Procedures Procedures    Medications Ordered in ED Medications  diphenhydrAMINE (BENADRYL) capsule 50 mg (has no administration in time range)    Or  diphenhydrAMINE (BENADRYL) injection 50 mg (has no administration in time range)  methylPREDNISolone sodium succinate (SOLU-MEDROL) 40 mg/mL injection 40 mg (40 mg Intravenous Given 12/01/23 1538)    ED Course/ Medical Decision Making/ A&P Clinical Course as of 12/01/23 1602  Fri Dec 01, 2023  1458  Hx htn, hld, pre-DM  L shoulder replacement earlier today L chest pain when waking from anesthesia, clammy, diaphoretic, spo2 high 80s > 2L [place] EKG with ST [ ]  f/u labs, CT PE  [GG]  1539 Patient has contrast allergy.  Ordered contrast premeds per protocol.  Nursing advised for timing. [JR]    Clinical Course User Index [GG] Mikeal Hawthorne, MD [JR] Gareth Eagle, PA-C                                 Medical Decision Making Amount and/or Complexity of Data Reviewed Labs: ordered. Radiology: ordered.  Risk Prescription drug management.   73 year old well-appearing male presenting for chest pain after shoulder replacement surgery today.  Exam was unremarkable.  DDx includes ACS, PE, pneumothorax,  COPD or asthma exacerbation, CHF exacerbation other.  Labs are pending at this time.  Plan will be to evaluate for ACS versus PE.  Given the nature of his acute hypoxia and chest pain primary concern is PE at this time.  Patient does have a contrast allergy.  Contrast premeds ordered per protocol.  CTA chest is pending.  Patient remains stable on 2 L nasal cannula.  Signed out to oncoming ED resident Consuello Masse, MD.         Final Clinical Impression(s) / ED Diagnoses Final diagnoses:  Chest pain, unspecified type    Rx / DC Orders ED Discharge Orders     None         Domenique, Sout[place], PA-C 12/01/23 1603    Cathren Laine, MD 12/02/23 1401

## 2023-12-01 NOTE — ED Triage Notes (Signed)
Pt bib [facility] from surgical center. Pt had surgery this morning and is now 2 hours post op. Pt is now complaining of sob, chest pain and new onset of afib/ems. Pt is cool, clammy and diaphoretic.   Pt had total replacement of left shoulder this morning. Pt has no hx of afib or CP.   Pt does have CAD.    4zofran .4 nitroglycrin 324 mg aspiring   20G Rhand

## 2023-12-02 ENCOUNTER — Encounter (HOSPITAL_COMMUNITY): Payer: Self-pay | Admitting: Internal Medicine

## 2023-12-02 ENCOUNTER — Inpatient Hospital Stay (HOSPITAL_COMMUNITY): Payer: Medicare Other

## 2023-12-02 ENCOUNTER — Other Ambulatory Visit: Payer: Self-pay

## 2023-12-02 DIAGNOSIS — J9601 Acute respiratory failure with hypoxia: Secondary | ICD-10-CM | POA: Diagnosis not present

## 2023-12-02 DIAGNOSIS — R079 Chest pain, unspecified: Secondary | ICD-10-CM

## 2023-12-02 LAB — CBC
HCT: 37.9 % — ABNORMAL LOW (ref 39.0–52.0)
HCT: 38.6 % — ABNORMAL LOW (ref 39.0–52.0)
Hemoglobin: 13.3 g/dL (ref 13.0–17.0)
Hemoglobin: 13.5 g/dL (ref 13.0–17.0)
MCH: 31.2 pg (ref 26.0–34.0)
MCH: 31.4 pg (ref 26.0–34.0)
MCHC: 35 g/dL (ref 30.0–36.0)
MCHC: 35.1 g/dL (ref 30.0–36.0)
MCV: 89.1 fL (ref 80.0–100.0)
MCV: 89.4 fL (ref 80.0–100.0)
Platelets: 168 10*3/uL (ref 150–400)
Platelets: 169 10*3/uL (ref 150–400)
RBC: 4.24 MIL/uL (ref 4.22–5.81)
RBC: 4.33 MIL/uL (ref 4.22–5.81)
RDW: 13.2 % (ref 11.5–15.5)
RDW: 13.5 % (ref 11.5–15.5)
WBC: 12.7 10*3/uL — ABNORMAL HIGH (ref 4.0–10.5)
WBC: 13.8 10*3/uL — ABNORMAL HIGH (ref 4.0–10.5)
nRBC: 0 % (ref 0.0–0.2)
nRBC: 0 % (ref 0.0–0.2)

## 2023-12-02 LAB — MAGNESIUM: Magnesium: 2.1 mg/dL (ref 1.7–2.4)

## 2023-12-02 LAB — HEMOGLOBIN A1C
Hgb A1c MFr Bld: 5.7 % — ABNORMAL HIGH (ref 4.8–5.6)
Mean Plasma Glucose: 116.89 mg/dL

## 2023-12-02 LAB — LIPID PANEL
Cholesterol: 166 mg/dL (ref 0–200)
HDL: 29 mg/dL — ABNORMAL LOW (ref >40)
LDL Cholesterol: UNDETERMINED mg/dL (ref 0–99)
Total CHOL/HDL Ratio: 5.7 {ratio}
Triglycerides: 405 mg/dL — ABNORMAL HIGH (ref <150)
VLDL: UNDETERMINED mg/dL (ref 0–40)

## 2023-12-02 LAB — CBG MONITORING, ED
Glucose-Capillary: 163 mg/dL — ABNORMAL HIGH (ref 70–99)
Glucose-Capillary: 142 mg/dL — ABNORMAL HIGH (ref 70–99)

## 2023-12-02 LAB — BASIC METABOLIC PANEL
Anion gap: 13 (ref 5–15)
BUN: 23 mg/dL (ref 8–23)
CO2: 23 mmol/L (ref 22–32)
Calcium: 9.2 mg/dL (ref 8.9–10.3)
Chloride: 99 mmol/L (ref 98–111)
Creatinine, Ser: 1.23 mg/dL (ref 0.61–1.24)
GFR, Estimated: >60 mL/min (ref >60)
Glucose, Bld: 198 mg/dL — ABNORMAL HIGH (ref 70–99)
Potassium: 4.2 mmol/L (ref 3.5–5.1)
Sodium: 135 mmol/L (ref 135–145)

## 2023-12-02 LAB — TROPONIN I (HIGH SENSITIVITY): Troponin I (High Sensitivity): 53 ng/L — ABNORMAL HIGH (ref <18)

## 2023-12-02 LAB — ECHOCARDIOGRAM COMPLETE
Area-P 1/2: 4.15 cm2
S' Lateral: 3.1 cm

## 2023-12-02 LAB — GLUCOSE, CAPILLARY
Glucose-Capillary: 139 mg/dL — ABNORMAL HIGH (ref 70–99)
Glucose-Capillary: 127 mg/dL — ABNORMAL HIGH (ref 70–99)

## 2023-12-02 LAB — PHOSPHORUS: Phosphorus: 3.4 mg/dL (ref 2.5–4.6)

## 2023-12-02 LAB — LDL CHOLESTEROL, DIRECT: Direct LDL: 94 mg/dL (ref 0–99)

## 2023-12-02 MED ORDER — ROSUVASTATIN CALCIUM 5 MG PO TABS
5.0000 mg | ORAL_TABLET | Freq: Every day | ORAL | Status: DC
  Administered 2023-12-02 – 2023-12-03 (×2): 5 mg via ORAL
  Filled 2023-12-02 (×2): qty 1

## 2023-12-02 MED ORDER — PERFLUTREN LIPID MICROSPHERE
1.0000 mL | INTRAVENOUS | Status: AC | PRN
  Administered 2023-12-02: 2 mL via INTRAVENOUS

## 2023-12-02 MED ORDER — SODIUM CHLORIDE 0.9 % IV SOLN
1.0000 g | INTRAVENOUS | Status: DC
  Administered 2023-12-02 – 2023-12-03 (×2): 1 g via INTRAVENOUS
  Filled 2023-12-02 (×2): qty 10

## 2023-12-02 MED ORDER — FENOFIBRATE 54 MG PO TABS
54.0000 mg | ORAL_TABLET | Freq: Every day | ORAL | Status: DC
  Administered 2023-12-02: 54 mg via ORAL
  Filled 2023-12-02 (×2): qty 1

## 2023-12-02 MED ORDER — SODIUM CHLORIDE 0.9 % IV SOLN
500.0000 mg | INTRAVENOUS | Status: DC
  Administered 2023-12-02 – 2023-12-03 (×2): 500 mg via INTRAVENOUS
  Filled 2023-12-02 (×2): qty 5

## 2023-12-02 MED ORDER — IPRATROPIUM-ALBUTEROL 0.5-2.5 (3) MG/3ML IN SOLN: 3.0000 mL | Freq: Four times a day (QID) | RESPIRATORY_TRACT | Status: DC | PRN

## 2023-12-02 MED ORDER — AMLODIPINE BESYLATE 10 MG PO TABS
10.0000 mg | ORAL_TABLET | Freq: Every day | ORAL | Status: DC
  Administered 2023-12-02 – 2023-12-03 (×2): 10 mg via ORAL
  Filled 2023-12-02 (×2): qty 1

## 2023-12-02 NOTE — ED Notes (Signed)
ED TO INPATIENT HANDOFF REPORT  ED Nurse Name and Phone #: Cheral Bay Name/Age/Gender Leonard Burke 73 y.o. male Room/Bed: 039C/039C  Code Status   Code Status: Full Code  Home/SNF/Other Home Patient oriented to: self, place, time, and situation Is this baseline? Yes   Triage Complete: Triage complete  Chief Complaint Acute hypoxic respiratory failure (HCC) [J96.01]  Triage Note Pt bib [facility] from surgical center. Pt had surgery this morning and is now 2 hours post op. Pt is now complaining of sob, chest pain and new onset of afib/ems. Pt is cool, clammy and diaphoretic.   Pt had total replacement of left shoulder this morning. Pt has no hx of afib or CP.   Pt does have CAD.    4zofran .4 nitroglycrin 324 mg aspiring   20G Rhand    Allergies Allergies  Allergen Reactions   Codeine Nausea And Vomiting   Penicillins Rash    Tolerated Cephalosporin 12/09/21     Ivp Dye [Iodinated Contrast Media] Hives   Multihance [Gadobenate] Hives and Itching    Per MRI MD note; would need 13hr prep    Level of Care/Admitting Diagnosis ED Disposition     ED Disposition  Admit   Condition  --   Comment  Hospital Area: [facility] [[id number]]  Level of Care: Telemetry Cardiac [[id number]]  May admit patient to [facility] or [facility] if equivalent level of care is available:: Yes  Covid Evaluation: Asymptomatic - no recent exposure (last 10 days) testing not required  Diagnosis: Acute hypoxic respiratory failure [facility]) [[id number]]  Admitting Physician: Darlin Drop [[id number]]  Attending Physician: Darlin Drop [[id number]]  Certification:: I certify this patient will need inpatient services for at least 2 midnights  Expected Medical Readiness: 12/03/2023          B Medical/Surgery History Past Medical History:  Diagnosis Date   Arthritis    Asthma    HTN (hypertension)    Hypercholesteremia    Hypertriglyceridemia    Past Surgical History:   Procedure Laterality Date   COLONOSCOPY     CYST EXCISION     KNEE ARTHROSCOPY     SHOULDER ARTHROSCOPY Bilateral    TOTAL KNEE ARTHROPLASTY Left 12/09/2021   Procedure: TOTAL KNEE ARTHROPLASTY;  Surgeon: Durene Romans, MD;  Location: [facility];  Service: Orthopedics;  Laterality: Left;   TOTAL KNEE ARTHROPLASTY Right 02/03/2022   Procedure: TOTAL KNEE ARTHROPLASTY;  Surgeon: Durene Romans, MD;  Location: [facility];  Service: Orthopedics;  Laterality: Right;     A IV Location/Drains/Wounds Patient Lines/Drains/Airways Status     Active Line/Drains/Airways     Name Placement date Placement time Site Days   Peripheral IV 12/01/23 20 G Anterior;Right Hand 12/01/23  --  Hand  1   Peripheral IV 12/01/23 20 G Right Antecubital 12/01/23  1511  Antecubital  1            Intake/Output Last 24 hours  Intake/Output Summary (Last 24 hours) at 12/02/2023 1307 Last data filed at 12/02/2023 0746 Gross per 24 hour  Intake --  Output 500 ml  Net -500 ml    Labs/Imaging Results for orders placed or performed during the hospital encounter of 12/01/23 (from the past 48 hours)  Brain natriuretic peptide     Status: None   Collection Time: 12/01/23  2:19 PM  Result Value Ref Range   B Natriuretic Peptide 51.5 0.0 - 100.0 pg/mL    Comment: Performed at  [facility], [address], [place], [place] [address]  Basic metabolic panel     Status: Abnormal   Collection Time: 12/01/23  2:19 PM  Result Value Ref Range   Sodium 134 (L) 135 - 145 mmol/L   Potassium 3.9 3.5 - 5.1 mmol/L   Chloride 98 98 - 111 mmol/L   CO2 23 22 - 32 mmol/L   Glucose, Bld 194 (H) 70 - 99 mg/dL    Comment: Glucose reference range applies only to samples taken after fasting for at least 8 hours.   BUN 22 8 - 23 mg/dL   Creatinine, Ser 9.81 0.61 - 1.24 mg/dL   Calcium 9.1 8.9 - 19.1 mg/dL   GFR, Estimated >47 >82 mL/min    Comment: (NOTE) Calculated using the CKD-EPI Creatinine Equation (2021)    Anion gap 13 5 -  15    Comment: Performed at [facility], [address], [place], [place] [address]  Troponin I (High Sensitivity)     Status: None   Collection Time: 12/01/23  2:19 PM  Result Value Ref Range   Troponin I (High Sensitivity) 12 <18 ng/L    Comment: (NOTE) Elevated high sensitivity troponin I (hsTnI) values and significant  changes across serial measurements may suggest ACS but many other  chronic and acute conditions are known to elevate hsTnI results.  Refer to the "Links" section for chest pain algorithms and additional  guidance. Performed at [facility], [address] [place], [place] [address]   CBC     Status: Abnormal   Collection Time: 12/01/23  2:20 PM  Result Value Ref Range   WBC 13.3 (H) 4.0 - 10.5 K/uL   RBC 4.34 4.22 - 5.81 MIL/uL   Hemoglobin 13.5 13.0 - 17.0 g/dL   HCT 78.4 69.6 - 29.5 %   MCV 90.8 80.0 - 100.0 fL   MCH 31.1 26.0 - 34.0 pg   MCHC 34.3 30.0 - 36.0 g/dL   RDW 28.4 13.2 - 44.0 %   Platelets 163 150 - 400 K/uL   nRBC 0.0 0.0 - 0.2 %    Comment: Performed at [facility], [address], [place], [place] [address]  I-Stat Chem 8, ED     Status: Abnormal   Collection Time: 12/01/23  2:39 PM  Result Value Ref Range   Sodium 137 135 - 145 mmol/L   Potassium 4.0 3.5 - 5.1 mmol/L   Chloride 103 98 - 111 mmol/L   BUN 25 (H) 8 - 23 mg/dL   Creatinine, Ser 5.36 0.61 - 1.24 mg/dL   Glucose, Bld 644 (H) 70 - 99 mg/dL    Comment: Glucose reference range applies only to samples taken after fasting for at least 8 hours.   Calcium, Ion 1.07 (L) 1.15 - 1.40 mmol/L   TCO2 23 22 - 32 mmol/L   Hemoglobin 12.9 (L) 13.0 - 17.0 g/dL   HCT 03.4 (L) 74.2 - 59.5 %  Troponin I (High Sensitivity)     Status: Abnormal   Collection Time: 12/01/23  4:49 PM  Result Value Ref Range   Troponin I (High Sensitivity) 28 (H) <18 ng/L    Comment: (NOTE) Elevated high sensitivity troponin I (hsTnI) values and significant  changes across serial measurements  may suggest ACS but many other  chronic and acute conditions are known to elevate hsTnI results.  Refer to the "Links" section for chest pain algorithms and additional  guidance. Performed  at [facility], [address] [place], [place] [address]   Troponin I (High Sensitivity)     Status: Abnormal   Collection Time: 12/01/23  8:43 PM  Result Value Ref Range   Troponin I (High Sensitivity) 48 (H) <18 ng/L    Comment: RESULT CALLED TO, READ BACK BY AND VERIFIED WITH C. CHRISCO RN [phone] M. ALAMANO (NOTE) Elevated high sensitivity troponin I (hsTnI) values and significant  changes across serial measurements may suggest ACS but many other  chronic and acute conditions are known to elevate hsTnI results.  Refer to the "Links" section for chest pain algorithms and additional  guidance. Performed at [facility], [address], [place], [place] [address]   Creatinine, serum     Status: None   Collection Time: 12/01/23  8:43 PM  Result Value Ref Range   Creatinine, Ser 1.21 0.61 - 1.24 mg/dL   GFR, Estimated >14 >78 mL/min    Comment: (NOTE) Calculated using the CKD-EPI Creatinine Equation (2021) Performed at [facility], [address], [place], [place] [address]   Basic metabolic panel     Status: Abnormal   Collection Time: 12/02/23 12:32 AM  Result Value Ref Range   Sodium 135 135 - 145 mmol/L   Potassium 4.2 3.5 - 5.1 mmol/L   Chloride 99 98 - 111 mmol/L   CO2 23 22 - 32 mmol/L   Glucose, Bld 198 (H) 70 - 99 mg/dL    Comment: Glucose reference range applies only to samples taken after fasting for at least 8 hours.   BUN 23 8 - 23 mg/dL   Creatinine, Ser 1.30 0.61 - 1.24 mg/dL   Calcium 9.2 8.9 - 86.5 mg/dL   GFR, Estimated >78 >46 mL/min    Comment: (NOTE) Calculated using the CKD-EPI Creatinine Equation (2021)    Anion gap 13 5 - 15    Comment: Performed at [facility], [address], [place], [place] [address]  Magnesium     Status:  None   Collection Time: 12/02/23 12:32 AM  Result Value Ref Range   Magnesium 2.1 1.7 - 2.4 mg/dL    Comment: Performed at [facility], [address], [place], [place] [address]  Phosphorus     Status: None   Collection Time: 12/02/23 12:32 AM  Result Value Ref Range   Phosphorus 3.4 2.5 - 4.6 mg/dL    Comment: Performed at [facility], [address], [place], [place] [address]  Lipid panel     Status: Abnormal   Collection Time: 12/02/23 12:32 AM  Result Value Ref Range   Cholesterol 166 0 - 200 mg/dL   Triglycerides 027 (H) <150 mg/dL   HDL 29 (L) >25 mg/dL   Total CHOL/HDL Ratio 5.7 RATIO   VLDL UNABLE TO CALCULATE IF TRIGLYCERIDE OVER 400 mg/dL 0 - 40 mg/dL   LDL Cholesterol UNABLE TO CALCULATE IF TRIGLYCERIDE OVER 400 mg/dL 0 - 99 mg/dL    Comment:        Total Cholesterol/HDL:CHD Risk Coronary Heart Disease Risk Table                     Men   Women  1/2 Average Risk   3.4   3.3  Average Risk       5.0   4.4  2 X Average Risk   9.6   7.1  3 X Average Risk  23.4  11.0        Use the calculated Patient Ratio above and the CHD Risk Table to determine the patient's CHD Risk.        ATP III CLASSIFICATION (LDL):  <100     mg/dL   Optimal  604-540  mg/dL   Near or Above                    Optimal  130-159  mg/dL   Borderline  981-191  mg/dL   High  >478     mg/dL   Very High Performed at [facility], [address], [place], [place] [address]   Hemoglobin A1c     Status: Abnormal   Collection Time: 12/02/23 12:32 AM  Result Value Ref Range   Hgb A1c MFr Bld 5.7 (H) 4.8 - 5.6 %    Comment: (NOTE) Pre diabetes:          5.7%-6.4%  Diabetes:              >6.4%  Glycemic control for   <7.0% adults with diabetes    Mean Plasma Glucose 116.89 mg/dL    Comment: Performed at [facility], [address], [place], [place] [address]  Troponin I (High Sensitivity)     Status: Abnormal   Collection Time: 12/02/23 12:32 AM  Result Value Ref  Range   Troponin I (High Sensitivity) 53 (H) <18 ng/L    Comment: RESULT CALLED TO, READ BACK BY AND VERIFIED WITH Lissa Merlin RN [phone] M. ALAMANO (NOTE) Elevated high sensitivity troponin I (hsTnI) values and significant  changes across serial measurements may suggest ACS but many other  chronic and acute conditions are known to elevate hsTnI results.  Refer to the "Links" section for chest pain algorithms and additional  guidance. Performed at [facility], [address] [place], [place] [address]   CBC     Status: Abnormal   Collection Time: 12/02/23 12:32 AM  Result Value Ref Range   WBC 12.7 (H) 4.0 - 10.5 K/uL   RBC 4.33 4.22 - 5.81 MIL/uL   Hemoglobin 13.5 13.0 - 17.0 g/dL   HCT 84.1 (L) 32.4 - 40.1 %   MCV 89.1 80.0 - 100.0 fL   MCH 31.2 26.0 - 34.0 pg   MCHC 35.0 30.0 - 36.0 g/dL   RDW 02.7 25.3 - 66.4 %   Platelets 169 150 - 400 K/uL   nRBC 0.0 0.0 - 0.2 %    Comment: Performed at [facility], [address] [place], [place] [address]  LDL cholesterol, direct     Status: None   Collection Time: 12/02/23 12:32 AM  Result Value Ref Range   Direct LDL 94 0 - 99 mg/dL    Comment: Performed at [facility], [address], [place], [place] [address]  CBG monitoring, ED     Status: Abnormal   Collection Time: 12/02/23  7:51 AM  Result Value Ref Range   Glucose-Capillary 163 (H) 70 - 99 mg/dL    Comment: Glucose reference range applies only to samples taken after fasting for at least 8 hours.   Comment 1 Notify RN    Comment 2 Document in Chart    ECHOCARDIOGRAM COMPLETE Result Date: 12/02/2023    ECHOCARDIOGRAM REPORT   Patient Name:   Leonard Burke Date of Exam: 12/02/2023 Medical Rec #:  [id number]  Height:       70.0 in Accession #:    [id number]        Weight:       220.0 lb Date of Birth:  11-Dec-1950         BSA:          2.174 m Patient Age:    72 years          BP: Patient Gender: M                 HR:           78 bpm. Exam  Location:  Inpatient Procedure: 2D Echo, Color Doppler and Cardiac Doppler Indications:    chest pain  Sonographer:    Delcie Roch RDCS Referring Phys: [id number] CAROLE N HALL IMPRESSIONS  1. Left ventricular ejection fraction, by estimation, is 65 to 70%. The left ventricle has normal function. The left ventricle has no regional wall motion abnormalities. There is moderate concentric left ventricular hypertrophy. Left ventricular diastolic parameters are indeterminate.  2. Right ventricular systolic function is normal. The right ventricular size is normal.  3. The mitral valve is normal in structure. No evidence of mitral valve regurgitation. No evidence of mitral stenosis.  4. The aortic valve is tricuspid. Aortic valve regurgitation is trivial. Aortic valve sclerosis/calcification is present, without any evidence of aortic stenosis.  5. The inferior vena cava is normal in size with greater than 50% respiratory variability, suggesting right atrial pressure of 3 mmHg. FINDINGS  Left Ventricle: Left ventricular ejection fraction, by estimation, is 65 to 70%. The left ventricle has normal function. The left ventricle has no regional wall motion abnormalities. The left ventricular internal cavity size was normal in size. There is  moderate concentric left ventricular hypertrophy. Left ventricular diastolic parameters are indeterminate. Right Ventricle: The right ventricular size is normal. No increase in right ventricular wall thickness. Right ventricular systolic function is normal. Left Atrium: Left atrial size was normal in size. Right Atrium: Right atrial size was normal in size. Pericardium: There is no evidence of pericardial effusion. Presence of epicardial fat layer. Mitral Valve: The mitral valve is normal in structure. No evidence of mitral valve regurgitation. No evidence of mitral valve stenosis. Tricuspid Valve: The tricuspid valve is normal in structure. Tricuspid valve regurgitation is not  demonstrated. No evidence of tricuspid stenosis. Aortic Valve: The aortic valve is tricuspid. Aortic valve regurgitation is trivial. Aortic valve sclerosis/calcification is present, without any evidence of aortic stenosis. Pulmonic Valve: The pulmonic valve was not well visualized. Pulmonic valve regurgitation is mild. No evidence of pulmonic stenosis. Aorta: The aortic root is normal in size and structure. Venous: The inferior vena cava is normal in size with greater than 50% respiratory variability, suggesting right atrial pressure of 3 mmHg. IAS/Shunts: No atrial level shunt detected by color flow Doppler.  LEFT VENTRICLE PLAX 2D LVIDd:         4.70 cm   Diastology LVIDs:         3.10 cm   LV e' medial:    5.55 cm/s LV PW:         1.50 cm   LV E/e' medial:  13.8 LV IVS:        1.30 cm   LV e' lateral:   7.94 cm/s LVOT diam:     2.50 cm   LV E/e' lateral: 9.6 LV SV:         109 LV SV Index:   50  LVOT Area:     4.91 cm  RIGHT VENTRICLE             IVC RV Basal diam:  3.00 cm     IVC diam: 1.70 cm RV S prime:     12.10 cm/s TAPSE (M-mode): 1.6 cm LEFT ATRIUM             Index        RIGHT ATRIUM           Index LA diam:        3.80 cm 1.75 cm/m   RA Area:     17.40 cm LA Vol (A2C):   54.8 ml 25.21 ml/m  RA Volume:   42.10 ml  19.37 ml/m LA Vol (A4C):   54.8 ml 25.21 ml/m LA Biplane Vol: 55.4 ml 25.49 ml/m  AORTIC VALVE LVOT Vmax:   106.00 cm/s LVOT Vmean:  74.000 cm/s LVOT VTI:    0.223 m  AORTA Ao Root diam: 4.30 cm Ao Asc diam:  4.10 cm MITRAL VALVE MV Area (PHT): 4.15 cm    SHUNTS MV Decel Time: 183 msec    Systemic VTI:  0.22 m MV E velocity: 76.50 cm/s  Systemic Diam: 2.50 cm MV A velocity: 78.40 cm/s MV E/A ratio:  0.98 Kardie Tobb DO Electronically signed by Thomasene Ripple DO Signature Date/Time: 12/02/2023/12:40:45 PM    Final    CT Angio Chest Pulmonary Embolism (PE) W or WO Contrast Result Date: 12/01/2023 CLINICAL DATA:  High probability for PE. EXAM: CT ANGIOGRAPHY CHEST WITH CONTRAST TECHNIQUE:  Multidetector CT imaging of the chest was performed using the standard protocol during bolus administration of intravenous contrast. Multiplanar CT image reconstructions and MIPs were obtained to evaluate the vascular anatomy. RADIATION DOSE REDUCTION: This exam was performed according to the departmental dose-optimization program which includes automated exposure control, adjustment of the mA and/or kV according to patient size and/or use of iterative reconstruction technique. CONTRAST:  75mL OMNIPAQUE IOHEXOL 350 MG/ML SOLN COMPARISON:  None Available. FINDINGS: Cardiovascular: Satisfactory opacification of the pulmonary arteries to the segmental level. No evidence of pulmonary embolism. Normal heart size. No pericardial effusion. There are atherosclerotic calcifications of the aorta and coronary arteries. Mediastinum/Nodes: No enlarged mediastinal, hilar, or axillary lymph nodes. Thyroid gland, trachea, and esophagus demonstrate no significant findings. Lungs/Pleura: There is left lower lobe airspace disease/consolidation. There are bands of atelectasis in the bilateral upper lobes, right middle lobe and right lower lobe. Trachea and central airways are patent. There is no pleural effusion or pneumothorax. Upper Abdomen: Small gallstones are present. Musculoskeletal: Recent left shoulder arthroplasty changes are present. There is soft tissue swelling and air surrounding the left shoulder. Right shoulder arthroplasty is also present. No acute fractures are seen. Review of the MIP images confirms the above findings. IMPRESSION: 1. No evidence for pulmonary embolism. 2. Left lower lobe airspace disease/consolidation worrisome for pneumonia. 3. Bilateral bands of atelectasis. 4. Cholelithiasis. 5. Recent left shoulder arthroplasty changes with soft tissue swelling and air surrounding the left shoulder. 6. Aortic atherosclerosis. Aortic Atherosclerosis (ICD10-I70.0). Electronically Signed   By: Darliss Cheney M.D.    On: 12/01/2023 19:26   DG Chest Port 1 View Result Date: 12/01/2023 CLINICAL DATA:  Chest pain shoulder replacement EXAM: PORTABLE CHEST 1 VIEW COMPARISON:  None Available. FINDINGS: Partially visualized left shoulder replacement with gas in the soft tissues. Hypoventilatory changes with probable atelectasis at the left base. Cannot exclude small left effusion. Cardiac silhouette is enlarged. There is no  pneumothorax. Possible scarring or atelectasis at the left apex. IMPRESSION: 1. Hypoventilatory changes with probable atelectasis at the left base. Cannot exclude small left effusion. 2. Enlarged cardiac silhouette. 3. Question atelectasis or scarring at left apex, attention on follow-up chest radiography Electronically Signed   By: Jasmine Pang M.D.   On: 12/01/2023 15:12    Pending Labs Unresulted Labs (From admission, onward)     Start     Ordered   12/08/23 0500  Creatinine, serum  (enoxaparin (LOVENOX)    CrCl >/= 30 ml/min)  Weekly,   R     Comments: while on enoxaparin therapy    12/01/23 2025   12/02/23 0500  CBC  Once,   R        12/02/23 0500            Vitals/Pain Today's Vitals   12/02/23 0049 12/02/23 0645 12/02/23 0850 12/02/23 1200  BP: 133/79 (!) 141/76 125/65   Pulse: 85 75 80 76  Resp: 17 15 18 12   Temp: 98.4 F (36.9 C)  97.8 F (36.6 C)   TempSrc: Oral  Oral   SpO2: 93% 94% 95% 96%  PainSc:        Isolation Precautions No active isolations  Medications Medications  enoxaparin (LOVENOX) injection 40 mg (40 mg Subcutaneous Given 12/01/23 2101)  acetaminophen (TYLENOL) tablet 650 mg (has no administration in time range)  prochlorperazine (COMPAZINE) injection 5 mg (has no administration in time range)  polyethylene glycol (MIRALAX / GLYCOLAX) packet 17 g (has no administration in time range)  melatonin tablet 5 mg (has no administration in time range)  ipratropium-albuterol (DUONEB) 0.5-2.5 (3) MG/3ML nebulizer solution 3 mL (3 mLs Nebulization Not  Given 12/02/23 0810)  guaiFENesin-dextromethorphan (ROBITUSSIN DM) 100-10 MG/5ML syrup 5 mL (has no administration in time range)  insulin aspart (novoLOG) injection 0-9 Units ( Subcutaneous Not Given 12/02/23 0808)  insulin aspart (novoLOG) injection 0-5 Units (has no administration in time range)  perflutren lipid microspheres (DEFINITY) IV suspension (2 mLs Intravenous Given 12/02/23 1040)  cefTRIAXone (ROCEPHIN) 1 g in sodium chloride 0.9 % 100 mL IVPB (has no administration in time range)  azithromycin (ZITHROMAX) 500 mg in sodium chloride 0.9 % 250 mL IVPB (has no administration in time range)  methylPREDNISolone sodium succinate (SOLU-MEDROL) 40 mg/mL injection 40 mg (40 mg Intravenous Given 12/01/23 1538)  diphenhydrAMINE (BENADRYL) capsule 50 mg (50 mg Oral Given 12/01/23 1749)    Or  diphenhydrAMINE (BENADRYL) injection 50 mg ( Intravenous See Alternative 12/01/23 1749)  iohexol (OMNIPAQUE) 350 MG/ML injection 75 mL (75 mLs Intravenous Contrast Given 12/01/23 1918)  cefTRIAXone (ROCEPHIN) 2 g in sodium chloride 0.9 % 100 mL IVPB (0 g Intravenous Stopped 12/01/23 2129)    Mobility walks     Focused Assessments Cardiac Assessment Handoff:    No results found for: "CKTOTAL", "CKMB", "CKMBINDEX", "TROPONINI" No results found for: "DDIMER" Does the Patient currently have chest pain? No    R Recommendations: See Admitting Provider Note  Report given to:   Additional Notes: Pt is AO, walky, talky, had shoulder surgery yesterday, is a borderline diabetic, A1c was good though, he declined breathing treatment and insulin at breakfast this morning. Daughter has been at bedside.

## 2023-12-02 NOTE — ED Notes (Signed)
Patient left the floor in stable condition, AOX4, with his belongings and staff.

## 2023-12-02 NOTE — Progress Notes (Signed)
  Echocardiogram 2D Echocardiogram has been performed.  Delcie Roch 12/02/2023, 11:14 AM

## 2023-12-02 NOTE — ED Notes (Signed)
Patient and daughter have concerns about his medications, declined insulin due to being a borderline diabetic per primary physician and concerned the breathing treatment could cause him to have a heart attack.

## 2023-12-02 NOTE — Progress Notes (Signed)
Progress Note   Patient: Leonard Burke ZOX:[id number] DOB: 1951-09-21 DOA: 12/01/2023     1 DOS: the patient was seen and examined on 12/02/2023   Brief hospital course: SANDOR ARBOLEDA is a 73 y.o. male with medical history significant for prediabetes, hypertension, hyperlipidemia, chronic left shoulder pain status post left reverse shoulder arthroplasty, aortic atherosclerosis, asthma, who presented to the ER, 2-hour postop, with complaints of chest pain and shortness of breath.  Associated with generalized weakness for weeks.  Patient with mild troponin elevation and subsequently reviewed by cardiologist with less concerns of ACS.  Cardiologist recommended for patient to have echo.  Assessment and Plan:    Acute hypoxic respiratory failure secondary to left lower lobe pneumonia, POA Presented with O2 saturation in the 80s, improved on 2 L nasal cannula to the mid 90s. Maintain O2 saturation above 92% Continue ceftriaxone and azithromycin Pulmonary toilet Continue bronchodilators, antitussives as needed Continue incentive spirometer    Elevated troponin, suspect demand ischemia in the setting of acute hypoxia. Patient denies chest pains Case was reviewed by cardiologist and according to them less concerns for ACS Cardiologist recommends to follow-up on echocardiogram and does not recommend ACS protocol Echo performed today however results still pending Continue to monitor on telemetry   QTc prolongation Admission twelve-lead EKG with QTc 546 Avoid QTc prolonging agents Closely monitor on telemetry Optimize magnesium and potassium levels.   Prediabetes with hyperglycemia A1c 5.7 Heart healthy carb modified diet Monitor glucose closely   Hyperlipidemia Resume home regimen.   Asthma Resume home regimen.   Hypertension BPs are currently at goal. Resume home oral antihypertensives as blood pressure tolerates. Closely monitor vital signs.   Postop #1 status post left  reverse shoulder arthroplasty Pain control PT OT assessment Fall precautions.   Generalized weakness Continue PT OT   DVT prophylaxis: Subcu Lovenox daily.   Code Status: Full code.    Subjective:  Patient seen and examined at bedside this morning Admits to pain involving the left shoulder at the site of surgery Denies chest pain nausea vomiting diaphoresis or abdominal pain  Physical Exam: General: Elderly male laying in bed in no acute distress Cardiovascular: Regular rate and rhythm with no rubs or gallops.   Respiratory: Clear to auscultation with no wheezes or rales Abdomen: Soft nontender nondistended Muskuloskeletal: No cyanosis, clubbing or edema noted bilaterally Neuro: CN II-XII intact, strength, sensation, reflexes Skin: No ulcerative lesions noted or rashes Psychiatry: Judgement and insight appear normal.   Vitals:   12/02/23 0049 12/02/23 0645 12/02/23 0850 12/02/23 1200  BP: 133/79 (!) 141/76 125/65   Pulse: 85 75 80 76  Resp: 17 15 18 12   Temp: 98.4 F (36.9 C)  97.8 F (36.6 C)   TempSrc: Oral  Oral   SpO2: 93% 94% 95% 96%    Data Reviewed: I reviewed patient's CT scan of the chest that did not show any evidence of pulmonary embolism however I did show some left-sided pneumonia     Latest Ref Rng & Units 12/02/2023   12:32 AM 12/01/2023    8:43 PM 12/01/2023    2:39 PM  BMP  Glucose 70 - 99 mg/dL 811   914   BUN 8 - 23 mg/dL 23   25   Creatinine 7.82 - 1.24 mg/dL 9.56  2.13  0.86   Sodium 135 - 145 mmol/L 135   137   Potassium 3.5 - 5.1 mmol/L 4.2   4.0   Chloride 98 - 111 mmol/L  99   103   CO2 22 - 32 mmol/L 23     Calcium 8.9 - 10.3 mg/dL 9.2          Latest Ref Rng & Units 12/02/2023   12:32 AM 12/01/2023    2:39 PM 12/01/2023    2:20 PM  CBC  WBC 4.0 - 10.5 K/uL 12.7   13.3   Hemoglobin 13.0 - 17.0 g/dL 54.0  98.1  19.1   Hematocrit 39.0 - 52.0 % 38.6  38.0  39.4   Platelets 150 - 400 K/uL 169   163      Family Communication:  Discussed with patient's daughter present at bedside  Disposition: Status is: Inpatient  Time spent: 56 minutes  Author: Loyce Dys, MD 12/02/2023 1:17 PM  For on call review [email or web address].

## 2023-12-02 NOTE — Progress Notes (Signed)
Leonard Burke  MRN: [id number] DOB/Age: Jul 17, 1951 73 y.o.  Orthopedics Procedure:      Subjective: Following up for Leonard Burke who is postoperative day 1 from his left reverse shoulder arthroplasty.  He has been undergoing workup for atypical left sided chest pain.  He had an uncomplicated surgery and awakened in the recovery room slowly with some left chest wall pain and was somewhat diaphoretic.  He was then brought to [facility] emergency room where he was undergoing workup and is now scheduled to be admitted for possible pneumonia.  He and his daughter in the emergency department and he just underwent echocardiogram.  His left interscalene block is still somewhat effective and he is not short of breath but is having some left anterior chest wall discomfort.  Vital Signs Temp:  [97.4 F (36.3 C)-98.4 F (36.9 C)] 97.8 F (36.6 C) (01/25 0850) Pulse Rate:  [75-108] 80 (01/25 0850) Resp:  [15-18] 18 (01/25 0850) BP: (124-148)/(65-88) 125/65 (01/25 0850) SpO2:  [91 %-97 %] 95 % (01/25 0850)  Lab Results Recent Labs    12/01/23 1420 12/01/23 1439 12/02/23 0032  WBC 13.3*  --  12.7*  HGB 13.5 12.9* 13.5  HCT 39.4 38.0* 38.6*  PLT 163  --  169   BMET Recent Labs    12/01/23 1419 12/01/23 1439 12/01/23 2043 12/02/23 0032  NA 134* 137  --  135  K 3.9 4.0  --  4.2  CL 98 103  --  99  CO2 23  --   --  23  GLUCOSE 194* 198*  --  198*  BUN 22 25*  --  23  CREATININE 1.21 1.20 1.21 1.23  CALCIUM 9.1  --   --  9.2   No results found for: "INR"   Exam Left upper extremity is in his sling with his surgical bandage in place.  Moderate swelling noted as expected.  Neurovascularly he has good sensation to his fingers and hand but some of the block is still in effect as would be expected.        Plan 1. atypical postoperative left chest pain 2.  Status post left reverse shoulder arthroplasty  He has been started on antibiotics for possible left lower lobe  pneumonia, he just underwent echocardiogram and results are pending.  He has been somewhat hypoxic we appreciate medicine's ongoing management and care with Mr. Denz.  I will go ahead and put orders in for our traditional OT orders postoperative from left reverse shoulder arthroplasty.  PT is not necessary from a shoulder standpoint.  Would also encourage him to use ice on his shoulder.  Of note we do sometimes see atypical referred chest wall and axillary pain following interscalene block which can be referred from his surgical site.  We are hopeful that he will continue to stabilize and we will continue to follow along with you.  Please call for any concerns.  French Ana Hawthorne Day PA-C  12/02/2023, 12:23 PM Contact # [phone]

## 2023-12-03 ENCOUNTER — Inpatient Hospital Stay (HOSPITAL_COMMUNITY): Payer: Medicare Other

## 2023-12-03 ENCOUNTER — Encounter (HOSPITAL_COMMUNITY): Payer: Self-pay | Admitting: Internal Medicine

## 2023-12-03 DIAGNOSIS — R0789 Other chest pain: Secondary | ICD-10-CM

## 2023-12-03 DIAGNOSIS — J9601 Acute respiratory failure with hypoxia: Secondary | ICD-10-CM | POA: Diagnosis not present

## 2023-12-03 DIAGNOSIS — J189 Pneumonia, unspecified organism: Secondary | ICD-10-CM

## 2023-12-03 LAB — BASIC METABOLIC PANEL
Anion gap: 10 (ref 5–15)
BUN: 26 mg/dL — ABNORMAL HIGH (ref 8–23)
CO2: 24 mmol/L (ref 22–32)
Calcium: 8.7 mg/dL — ABNORMAL LOW (ref 8.9–10.3)
Chloride: 104 mmol/L (ref 98–111)
Creatinine, Ser: 1.07 mg/dL (ref 0.61–1.24)
GFR, Estimated: >60 mL/min (ref >60)
Glucose, Bld: 142 mg/dL — ABNORMAL HIGH (ref 70–99)
Potassium: 4.2 mmol/L (ref 3.5–5.1)
Sodium: 138 mmol/L (ref 135–145)

## 2023-12-03 LAB — CBC
HCT: 35.1 % — ABNORMAL LOW (ref 39.0–52.0)
Hemoglobin: 12.2 g/dL — ABNORMAL LOW (ref 13.0–17.0)
MCH: 31.4 pg (ref 26.0–34.0)
MCHC: 34.8 g/dL (ref 30.0–36.0)
MCV: 90.5 fL (ref 80.0–100.0)
Platelets: 153 10*3/uL (ref 150–400)
RBC: 3.88 MIL/uL — ABNORMAL LOW (ref 4.22–5.81)
RDW: 13.8 % (ref 11.5–15.5)
WBC: 8.9 10*3/uL (ref 4.0–10.5)
nRBC: 0 % (ref 0.0–0.2)

## 2023-12-03 LAB — GLUCOSE, CAPILLARY
Glucose-Capillary: 153 mg/dL — ABNORMAL HIGH (ref 70–99)
Glucose-Capillary: 162 mg/dL — ABNORMAL HIGH (ref 70–99)

## 2023-12-03 MED ORDER — METRONIDAZOLE 500 MG PO TABS
500.0000 mg | ORAL_TABLET | Freq: Three times a day (TID) | ORAL | Status: DC
  Administered 2023-12-03: 500 mg via ORAL
  Filled 2023-12-03: qty 1

## 2023-12-03 MED ORDER — AZITHROMYCIN 500 MG PO TABS: 500.0000 mg | ORAL_TABLET | Freq: Every day | ORAL | 0 refills | Status: AC

## 2023-12-03 MED ORDER — GUAIFENESIN ER 600 MG PO TB12
1200.0000 mg | ORAL_TABLET | Freq: Two times a day (BID) | ORAL | Status: DC
  Administered 2023-12-03: 1200 mg via ORAL
  Filled 2023-12-03: qty 2

## 2023-12-03 MED ORDER — CEFADROXIL 500 MG PO CAPS: 500.0000 mg | ORAL_CAPSULE | Freq: Two times a day (BID) | ORAL | Status: DC

## 2023-12-03 MED ORDER — IPRATROPIUM BROMIDE 0.02 % IN SOLN
0.5000 mg | Freq: Four times a day (QID) | RESPIRATORY_TRACT | Status: DC
  Filled 2023-12-03: qty 2.5

## 2023-12-03 MED ORDER — GUAIFENESIN ER 600 MG PO TB12: 600.0000 mg | ORAL_TABLET | Freq: Two times a day (BID) | ORAL | 0 refills | Status: AC

## 2023-12-03 MED ORDER — ACETAMINOPHEN 325 MG PO TABS: 650.0000 mg | ORAL_TABLET | Freq: Four times a day (QID) | ORAL | 0 refills | Status: AC | PRN

## 2023-12-03 MED ORDER — ASPIRIN 81 MG PO TBEC
81.0000 mg | DELAYED_RELEASE_TABLET | Freq: Every day | ORAL | 0 refills | Status: AC
Start: 2023-12-03 — End: ?

## 2023-12-03 MED ORDER — METRONIDAZOLE 500 MG PO TABS: 500.0000 mg | ORAL_TABLET | Freq: Three times a day (TID) | ORAL | 0 refills | Status: AC

## 2023-12-03 MED ORDER — CEFADROXIL 500 MG PO CAPS: 500.0000 mg | ORAL_CAPSULE | Freq: Two times a day (BID) | ORAL | 0 refills | Status: AC

## 2023-12-03 MED ORDER — POLYETHYLENE GLYCOL 3350 17 G PO PACK: 17.0000 g | PACK | Freq: Every day | ORAL | 0 refills | Status: AC | PRN

## 2023-12-03 MED ORDER — LEVALBUTEROL HCL 0.63 MG/3ML IN NEBU
0.6300 mg | INHALATION_SOLUTION | Freq: Four times a day (QID) | RESPIRATORY_TRACT | Status: DC
  Filled 2023-12-03: qty 3

## 2023-12-03 MED ORDER — AZITHROMYCIN 250 MG PO TABS: 250.0000 mg | ORAL_TABLET | Freq: Every day | ORAL | Status: DC

## 2023-12-03 NOTE — Evaluation (Signed)
Occupational Therapy Evaluation Patient Details Name: Leonard Burke MRN: [id number] DOB: [birth year] Today's Date: 12/03/2023   History of Present Illness Leonard Burke is a 73 y.o. male with PMH significant for prediabetes, hypertension, hyperlipidemia, aortic atherosclerosis, asthma, who presented to the ER from surgical center, 2-hour s/p left reverse shoulder arthroplasty, with complaints of chest pain and shortness of breath.   Clinical Impression   PTA, pt reports he was independent with ADL/IADL and functional mobility. Pt reports he has plenty of support from his family at d/c. Pt currently requires modA for UB dressing and sling management. He completed gentle AROM to elbow wrist and hand. Pt educated on shoulder precautions and management of ADL with adherence to precautions. Pt verbalized understanding. Pt requires contact guard assistance for functional mobility without AD. He reports a hx of bursitis in his back causing pain in the L side and guarded ambulation. At this time, recommend follow-up therapy per surgeon recommendation. Will continue to follow acutely and progress appropriately.        If plan is discharge home, recommend the following: A little help with bathing/dressing/bathroom;Assistance with cooking/housework    Functional Status Assessment  Patient has had a recent decline in their functional status and demonstrates the ability to make significant improvements in function in a reasonable and predictable amount of time.  Equipment Recommendations  None recommended by OT    Recommendations for Other Services       Precautions / Restrictions Precautions Precautions: Shoulder;Fall Type of Shoulder Precautions: L reverse TSA: If sitting in controlled environment, ok to come out of sling to give neck a break. Please sleep in it to protect until follow up in office.     OK to use operative arm for feeding, hygiene and ADLs.   Ok to instruct Pendulums and lap  slides as exercises. Ok to use operative arm within the following parameters for ADL purposes   Ok for PROM, AAROM, AROM within pain tolerance and within the following ROM   ER 20   ABD 45   FE 60 Shoulder Interventions: Shoulder sling/immobilizer;At all times;Off for dressing/bathing/exercises Precaution Booklet Issued: Yes (comment) Required Braces or Orthoses: Sling Restrictions Weight Bearing Restrictions Per Provider Order: Yes LUE Weight Bearing Per Provider Order: Non weight bearing      Mobility Bed Mobility Overal bed mobility: Needs Assistance Bed Mobility: Supine to Sit, Sit to Supine     Supine to sit: Contact guard, Used rails Sit to supine: HOB elevated, Contact guard assist   General bed mobility comments: contact guard assist for safety    Transfers Overall transfer level: Needs assistance Equipment used: None Transfers: Sit to/from Stand Sit to Stand: Contact guard assist           General transfer comment: pt reports increased effort to powerup into standing due to his bursitis and not having been oob much since surgery      Balance Overall balance assessment: Needs assistance Sitting-balance support: No upper extremity supported, Feet supported Sitting balance-Leahy Scale: Good     Standing balance support: Single extremity supported Standing balance-Leahy Scale: Fair Standing balance comment: single UE support for dynamic balance, able to static stand wihtout UE support                           ADL either performed or assessed with clinical judgement   ADL Overall ADL's : Needs assistance/impaired Eating/Feeding: Set up;Sitting Eating/Feeding Details (indicate cue type and  reason): pt is right hand dominant Grooming: Contact guard assist   Upper Body Bathing: Minimal assistance   Lower Body Bathing: Minimal assistance   Upper Body Dressing : Moderate assistance Upper Body Dressing Details (indicate cue type and reason): modA for  donning/doffing sling and UB clothing Lower Body Dressing: Minimal assistance;Sit to/from stand   Toilet Transfer: Electronics engineer Details (indicate cue type and reason): cga for safety Toileting- Clothing Manipulation and Hygiene: Contact guard assist       Functional mobility during ADLs: Contact guard assist General ADL Comments: pt ambulating in room without AD, requiring increased assistance for ADL secondary to functional limitations due to LUE surgery     Vision         Perception         Praxis         Pertinent Vitals/Pain Pain Assessment Pain Assessment: Faces Faces Pain Scale: Hurts little more Pain Location: Right side of back, pt reports he has hx of bursitis Pain Descriptors / Indicators: Sore, Guarding Pain Intervention(s): Limited activity within patient's tolerance, Monitored during session     Extremity/Trunk Assessment Upper Extremity Assessment Upper Extremity Assessment: LUE deficits/detail;RUE deficits/detail RUE Deficits / Details: WNL LUE Deficits / Details: sensation intact, grip strength 4/5; pronation/supination intact and WNL, unable to achieve full elbow extension, able to bring hand to mouth with control   Lower Extremity Assessment Lower Extremity Assessment: Defer to PT evaluation       Communication Communication Communication: Hearing impairment   Cognition Arousal: Alert Behavior During Therapy: WFL for tasks assessed/performed Overall Cognitive Status: Within Functional Limits for tasks assessed                                       General Comments  vss on RA SpO2 93%-95% on RA with exertion    Exercises     Shoulder Instructions      Home Living Family/patient expects to be discharged to:: Private residence Living Arrangements: Alone Available Help at Discharge: Family;Available PRN/intermittently Type of Home: Apartment Home Access: Ramped entrance     Home  Layout: One level     Bathroom Shower/Tub: Chief Strategy Officer: Standard Bathroom Accessibility: Yes How Accessible: Accessible via walker Home Equipment: Grab bars - tub/shower          Prior Functioning/Environment Prior Level of Function : Independent/Modified Independent             Mobility Comments: no AD ADLs Comments: independent still driving        OT Problem List: Decreased activity tolerance;Decreased range of motion;Decreased knowledge of use of DME or AE;Decreased knowledge of precautions;Impaired UE functional use;Pain      OT Treatment/Interventions: Self-care/ADL training;Therapeutic exercise;Therapeutic activities;Patient/family education    OT Goals(Current goals can be found in the care plan section) Acute Rehab OT Goals Patient Stated Goal: to go home OT Goal Formulation: With patient Time For Goal Achievement: 12/17/23 Potential to Achieve Goals: Good ADL Goals Pt Will Perform Upper Body Dressing: with set-up Additional ADL Goal #1: Pt will demonstrate independence with shoulder HEP with written handout. Additional ADL Goal #2: Pt will demonstrate independence with donning/doffing/managing sling.  OT Frequency: Min 1X/week    Co-evaluation              AM-PAC OT "6 Clicks" Daily Activity     Outcome Measure Help from another  person eating meals?: None Help from another person taking care of personal grooming?: A Little Help from another person toileting, which includes using toliet, bedpan, or urinal?: A Little Help from another person bathing (including washing, rinsing, drying)?: A Little Help from another person to put on and taking off regular upper body clothing?: A Lot Help from another person to put on and taking off regular lower body clothing?: A Little 6 Click Score: 18   End of Session Equipment Utilized During Treatment: Other (comment) (sling) Nurse Communication: Mobility status  Activity Tolerance:  Patient tolerated treatment well Patient left: in bed;with call bell/phone within reach;with bed alarm set  OT Visit Diagnosis: Other abnormalities of gait and mobility (R26.89);Pain Pain - Right/Left: Left Pain - part of body: Shoulder                Time: 5409-8119 OT Time Calculation (min): 35 min Charges:  OT General Charges $OT Visit: 1 Visit OT Evaluation $OT Eval Low Complexity: 1 Low OT Treatments $Self Care/Home Management : 8-22 mins  Rosey Bath OTR/L Acute Rehabilitation Services Office: [phone]   Providence Crosby 12/03/2023, 1:02 PM

## 2023-12-03 NOTE — Evaluation (Addendum)
Physical Therapy Evaluation Patient Details Name: Leonard Burke MRN: [id number] DOB: [birth year] Today's Date: 12/03/2023  History of Present Illness  Leonard Burke is a 73 y.o. male presented to [facility] ER from surgical center, 2-hour s/p left reverse shoulder arthroplasty, with complaints of chest pain and shortness of breath. PMH significant for prediabetes, hypertension, hyperlipidemia, aortic atherosclerosis, asthma  Clinical Impression  Pt is presenting below baseline level of functioning. Pt has assistance from daughters at home and from his sister PRN. Currently pt is demonstrating impaired balance; discussed using SPC at home for balance until his balance improves and pt in agreement. Once cleared by orthopedic surgeon pt will most likely need OPPT for L shoulder; until then pt may benefit from PT to work on balance and LE strength. Due to pt current functional status, home set up and available assistance at home recommending skilled physical therapy services 3x/week in order to address strength, balance and functional mobility to decrease risk for falls, injury and re-hospitalization.           If plan is discharge home, recommend the following: A little help with walking and/or transfers;Assistance with cooking/housework;Assist for transportation     Equipment Recommendations BSC/3in1     Functional Status Assessment Patient has had a recent decline in their functional status and demonstrates the ability to make significant improvements in function in a reasonable and predictable amount of time.     Precautions / Restrictions Precautions Precautions: Shoulder;Fall Type of Shoulder Precautions: L reverse TSA: If sitting in controlled environment, ok to come out of sling to give neck a break. Please sleep in it to protect until follow up in office.     OK to use operative arm for feeding, hygiene and ADLs.   Ok to instruct Pendulums and lap slides as exercises. Ok to use operative  arm within the following parameters for ADL purposes   Ok for PROM, AAROM, AROM within pain tolerance and within the following ROM   ER 20   ABD 45   FE 60 Shoulder Interventions: Shoulder sling/immobilizer;At all times;Off for dressing/bathing/exercises Required Braces or Orthoses: Sling Restrictions Weight Bearing Restrictions Per Provider Order: Yes LUE Weight Bearing Per Provider Order: Non weight bearing      Mobility  Bed Mobility Overal bed mobility: Needs Assistance Bed Mobility: Supine to Sit, Sit to Supine     Supine to sit: Contact guard, Used rails Sit to supine: HOB elevated, Contact guard assist   General bed mobility comments: contact guard assist for safety    Transfers Overall transfer level: Needs assistance Equipment used: None Transfers: Sit to/from Stand Sit to Stand: Contact guard assist      Ambulation/Gait Ambulation/Gait assistance: Contact guard assist Gait Distance (Feet): 20 Feet Assistive device: IV Pole Gait Pattern/deviations: Wide base of support, Step-through pattern, Decreased step length - right, Decreased step length - left Gait velocity: decreased Gait velocity interpretation: <1.31 ft/sec, indicative of household ambulator   General Gait Details: short step through to partial step through reciprocal gait pattern. Pt has balance deficits and requires UE support for balance during gait.  Stairs Stairs:  (ramped entrance)               Balance Overall balance assessment: Needs assistance Sitting-balance support: No upper extremity supported, Feet supported Sitting balance-Leahy Scale: Good     Standing balance support: Single extremity supported Standing balance-Leahy Scale: Fair Standing balance comment: single UE support for dynamic balance, able to static stand wihtout UE support.  Discussed using cane at home for safety         Pertinent Vitals/Pain Pain Assessment Pain Assessment: 0-10 Pain Score: 5  Pain  Location: Right side of back, pt reports he has hx of bursitis, R hip and L shoulder Pain Descriptors / Indicators: Sore, Guarding Pain Intervention(s): Monitored during session, Limited activity within patient's tolerance    Home Living Family/patient expects to be discharged to:: Private residence Living Arrangements: Alone Available Help at Discharge: Family;Available PRN/intermittently Type of Home: Apartment Home Access: Ramped entrance       Home Layout: One level Home Equipment: Grab bars - tub/shower      Prior Function Prior Level of Function : Independent/Modified Independent             Mobility Comments: no AD ADLs Comments: independent still driving     Extremity/Trunk Assessment   Upper Extremity Assessment Upper Extremity Assessment: Defer to OT evaluation    Lower Extremity Assessment Lower Extremity Assessment: Generalized weakness    Cervical / Trunk Assessment Cervical / Trunk Assessment: Normal  Communication   Communication Communication: Hearing impairment Cueing Techniques: Verbal cues  Cognition Arousal: Alert Behavior During Therapy: WFL for tasks assessed/performed Overall Cognitive Status: Within Functional Limits for tasks assessed      General Comments General comments (skin integrity, edema, etc.): Pt VS remained WNL throughout session        Assessment/Plan    PT Assessment Patient needs continued PT services  PT Problem List Decreased strength;Decreased balance;Pain;Decreased mobility       PT Treatment Interventions DME instruction;Therapeutic activities;Gait training;Therapeutic exercise;Patient/family education;Balance training;Functional mobility training    PT Goals (Current goals can be found in the Care Plan section)  Acute Rehab PT Goals Patient Stated Goal: to improve mobility and go to OPPT once better for L shoulder PT Goal Formulation: With patient Time For Goal Achievement: 12/17/23 Potential to Achieve  Goals: Fair    Frequency Min 1X/week        AM-PAC PT "6 Clicks" Mobility  Outcome Measure Help needed turning from your back to your side while in a flat bed without using bedrails?: A Little Help needed moving from lying on your back to sitting on the side of a flat bed without using bedrails?: A Little Help needed moving to and from a bed to a chair (including a wheelchair)?: A Little Help needed standing up from a chair using your arms (e.g., wheelchair or bedside chair)?: A Little Help needed to walk in hospital room?: A Little Help needed climbing 3-5 steps with a railing? : A Lot 6 Click Score: 17    End of Session Equipment Utilized During Treatment: Gait belt Activity Tolerance: Patient tolerated treatment well;Patient limited by fatigue;Patient limited by pain (pain in R hip) Patient left: in bed;with call bell/phone within reach;with family/visitor present Nurse Communication: Mobility status PT Visit Diagnosis: Other abnormalities of gait and mobility (R26.89);Unsteadiness on feet (R26.81)    Time: 7846-9629 PT Time Calculation (min) (ACUTE ONLY): 20 min   Charges:   PT Evaluation $PT Eval Low Complexity: 1 Low   PT General Charges $$ ACUTE PT VISIT: 1 Visit         Harrel Carina, DPT, CLT  Acute Rehabilitation Services Office: [phone] (Secure chat preferred)   Claudia Desanctis 12/03/2023, 4:25 PM

## 2023-12-03 NOTE — Plan of Care (Signed)
  Problem: Education: Goal: Ability to describe self-care measures that may prevent or decrease complications (Diabetes Survival Skills Education) will improve Outcome: Adequate for Discharge Goal: Individualized Educational Video(s) Outcome: Adequate for Discharge   Problem: Coping: Goal: Ability to adjust to condition or change in health will improve Outcome: Adequate for Discharge   Problem: Fluid Volume: Goal: Ability to maintain a balanced intake and output will improve Outcome: Completed/Met   Problem: Health Behavior/Discharge Planning: Goal: Ability to identify and utilize available resources and services will improve Outcome: Completed/Met Goal: Ability to manage health-related needs will improve Outcome: Completed/Met   Problem: Metabolic: Goal: Ability to maintain appropriate glucose levels will improve Outcome: Adequate for Discharge   Problem: Nutritional: Goal: Maintenance of adequate nutrition will improve Outcome: Adequate for Discharge Goal: Progress toward achieving an optimal weight will improve Outcome: Completed/Met   Problem: Skin Integrity: Goal: Risk for impaired skin integrity will decrease Outcome: Completed/Met   Problem: Tissue Perfusion: Goal: Adequacy of tissue perfusion will improve Outcome: Completed/Met   Problem: Activity: Goal: Ability to tolerate increased activity will improve Outcome: Completed/Met   Problem: Clinical Measurements: Goal: Ability to maintain a body temperature in the normal range will improve Outcome: Completed/Met   Problem: Respiratory: Goal: Ability to maintain adequate ventilation will improve Outcome: Completed/Met Goal: Ability to maintain a clear airway will improve Outcome: Completed/Met   Problem: Education: Goal: Knowledge of General Education information will improve Description: Including pain rating scale, medication(s)/side effects and non-pharmacologic comfort measures Outcome: Completed/Met    Problem: Health Behavior/Discharge Planning: Goal: Ability to manage health-related needs will improve Outcome: Completed/Met   Problem: Clinical Measurements: Goal: Ability to maintain clinical measurements within normal limits will improve Outcome: Completed/Met Goal: Will remain free from infection Outcome: Completed/Met Goal: Diagnostic test results will improve Outcome: Adequate for Discharge Goal: Respiratory complications will improve Outcome: Adequate for Discharge Goal: Cardiovascular complication will be avoided Outcome: Adequate for Discharge   Problem: Activity: Goal: Risk for activity intolerance will decrease Outcome: Adequate for Discharge   Problem: Nutrition: Goal: Adequate nutrition will be maintained Outcome: Completed/Met   Problem: Elimination: Goal: Will not experience complications related to bowel motility Outcome: Completed/Met Goal: Will not experience complications related to urinary retention Outcome: Completed/Met   Problem: Pain Managment: Goal: General experience of comfort will improve and/or be controlled Outcome: Completed/Met   Problem: Safety: Goal: Ability to remain free from injury will improve Outcome: Completed/Met   Problem: Skin Integrity: Goal: Risk for impaired skin integrity will decrease Outcome: Completed/Met

## 2023-12-03 NOTE — Progress Notes (Addendum)
Pt & dgtr at bedside given discharge instructions including need for oxygen at least temporarily while his lungs recover from pna. Pt & dgtr advised of home care Including but not limited to medications, appointments , IS  , flutter valve & when to call his provider. Pt & dgtr verbalized understanding. All questions were answered. Sanda Linger, RN   AVS was updated at bedside for pt to start his Cefadroxil tomorrow  morning as his pharmacy is closed & per provider in AVS it's okay for him to start it tomorrow.

## 2023-12-03 NOTE — Progress Notes (Signed)
SATURATION QUALIFICATIONS: (This note is used to comply with regulatory documentation for home oxygen)  Patient Saturations on Room Air at Rest = 95%  Patient Saturations on Room Air while Ambulating = 87%  Patient Saturations on 2L Liters of oxygen while Ambulating = 93%  Please briefly explain why patient needs home oxygen:  When patient walks his saturation eventually drops to 87% but does recovers by itself with rest.  With Oxygen patient's oxygen never dropped lower than 93% while walking.

## 2023-12-03 NOTE — TOC Transition Note (Signed)
Transition of Care [facility]) - Discharge Note   Patient Details  Name: Leonard Burke MRN: [id number] Date of Birth: January 08, 1951  [facility]) CM/SW Contact:  Ronny Bacon, RN Phone Number: 12/03/2023, 4:53 PM   Clinical Narrative:   Patient is being discharged today. Home Oxygen and 3:1 ordered through [facility] with [employer] to be delivered to bedside.    Final next level of care: Home/Self Care Barriers to Discharge: No Barriers Identified   Patient Goals and CMS Choice            Discharge Placement                       Discharge Plan and Services Additional resources added to the After Visit Summary for                  DME Arranged: Oxygen, 3-N-1 DME Agency: [employer] Date DME Agency Contacted: 12/03/23 Time DME Agency Contacted: [id number] Representative spoke with at DME Agency: Vaughan Basta            Social Drivers of Health (SDOH) Interventions SDOH Screenings   Food Insecurity: No Food Insecurity (12/02/2023)  Housing: Unknown (12/02/2023)  Transportation Needs: No Transportation Needs (12/02/2023)  Utilities: Not At Risk (12/02/2023)  Social Connections: Socially Isolated (12/02/2023)  Tobacco Use: Medium Risk (12/03/2023)     Readmission Risk Interventions     No data to display

## 2023-12-05 DIAGNOSIS — Z471 Aftercare following joint replacement surgery: Secondary | ICD-10-CM | POA: Diagnosis not present

## 2023-12-05 DIAGNOSIS — M25512 Pain in left shoulder: Secondary | ICD-10-CM | POA: Diagnosis not present

## 2023-12-05 DIAGNOSIS — I890 Lymphedema, not elsewhere classified: Secondary | ICD-10-CM | POA: Diagnosis not present

## 2023-12-05 DIAGNOSIS — M19012 Primary osteoarthritis, left shoulder: Secondary | ICD-10-CM | POA: Diagnosis not present

## 2023-12-05 DIAGNOSIS — Z96612 Presence of left artificial shoulder joint: Secondary | ICD-10-CM | POA: Diagnosis not present

## 2023-12-05 NOTE — Hospital Course (Addendum)
Leonard Burke is a 73 y.o. male with medical history significant for prediabetes, hypertension, hyperlipidemia, chronic left shoulder pain status post left reverse shoulder arthroplasty, aortic atherosclerosis, asthma, who presented to the ER, 2-hour postop, with complaints of chest pain and shortness of breath.  Associated with generalized weakness for weeks.  Patient with mild troponin elevation and subsequently reviewed by cardiologist with less concerns of ACS.  Cardiologist recommended for patient to have echo was done and showed a normal EF of 65 to 70%.  Cardiology recommended outpatient follow-up and this is being arranged.  He is medically stable for discharge but prior to discharge he did desaturate so he will be sent home on oxygen and will continue antibiotics for his pneumonia.  Will place him on cefdinir, azithromycin and Flagyl.  He will need repeat chest x-ray in 3 to 6 weeks and follow-up with his PCP within 1 week  Assessment and Plan:  Acute Hypoxic Respiratory Failure Secondary to Left Lower Lobe Pneumonia, POA -Presented with O2 saturation in the 80s, improved on 2 L nasal cannula to the mid 90s. SpO2: 95 % O2 Flow Rate (L/min): 2 L/min -Maintain O2 saturation above 92% -Continue ceftriaxone and azithromycin and changed to p.o. azithromycin, cefdinir and add Flagyl given concern for aspiration -Pulmonary toilet -Continue bronchodilators, antitussives as needed -Continue incentive spirometer and flutter valve -CXR prior to D/C showed "Low volume film. The cardio pericardial silhouette is enlarged. Curvilinear areas of subsegmental atelectasis or scarring again noted in the parahilar left lung and to a lesser degree in the parahilar right lung. Retrocardiac opacity is similar to prior and was characterized as worrisome for pneumonia on chest CT 2 days ago. Probable small left effusion. Status post bilateral shoulder replacement. Telemetry leads overlie the chest." -Repeat chest  x-ray in 3 to 6 weeks and will need outpatient follow-up and if not improving will need outpatient referral to pulmonary   Elevated Troponin, suspect Demand Ischemia in the setting of Acute Hypoxia. -Patient denies chest pains -Case was reviewed by cardiologist and according to them less concerns for ACS -Cardiologist recommends to follow-up on echocardiogram and does not recommend ACS protocol Echo performed and showed Normal EF of 65-70% and also showed The left ventricle has no regional wall motion abnormalities. There is moderate concentric left ventricular hypertrophy. Left ventricular diastolic parameters are indeterminate. Right ventricular systolic function is normal  -Continue to monitor on telemetry   QTc prolongation -Admission twelve-lead EKG with QTc 546 -Avoid QTc prolonging agents -Closely monitor on telemetry -Optimize magnesium and potassium levels.   Prediabetes with hyperglycemia -A1c 5.7 -Heart healthy carb modified diet -Monitor glucose closely   Hyperlipidemia -Resume home regimen.   Asthma -Resume home regimen and will place on flutter valve and incentive spirometer -Continue with albuterol inhaler    Hypertension -BPs are currently at goal. -Resume home oral antihypertensives as blood pressure tolerates. -Closely monitor vital signs. -Follow-up with PCP  Normocytic Anemia -Hgb/Hct Trend: Recent Labs  Lab 12/01/23 1420 12/01/23 1439 12/02/23 0032 12/02/23 1500 12/03/23 0344  HGB 13.5 12.9* 13.5 13.3 12.2*  HCT 39.4 38.0* 38.6* 37.9* 35.1*  MCV 90.8  --  89.1 89.4 90.5  -Check Anemia Panel in the outpatient setting -Continue to Monitor for S/Sx of Bleeding and repeat CBC within 1 week   Postop #2 status post left reverse shoulder arthroplasty -Pain control -PT OT assessment -Fall precautions.   Generalized weakness Continue PT OT and they are recommending following up with his primary surgeon and will  follow-up  Class II  Obesity -Complicates overall prognosis and care -Estimated body mass index is 35.56 kg/m as calculated from the following:   Height as of this encounter: 5' 9.5" (1.765 m).   Weight as of this encounter: 110.8 kg.  -Weight Loss and Dietary Counseling given

## 2023-12-05 NOTE — Discharge Summary (Signed)
Physician Discharge Summary   Patient: Leonard Burke MRN: [id number] DOB: 05/12/1951  Admit date:     12/01/2023  Discharge date: 12/03/2023  Discharge Physician: Marguerita Merles, DO   PCP: Joycelyn Rua, MD   Recommendations at discharge:   Follow-up with PCP within 1 to 2 weeks and repeat CBC, CMP, mag, Phos within 1 weeks Follow-up with Cardiology in outpatient setting Follow-up with Orthopedic Surgery in outpatient setting Repeat Chest X-Ray in 3 to 6 weeks  Discharge Diagnoses: Principal Problem:   Acute hypoxic respiratory failure (HCC)  Resolved Problems:   * No resolved hospital problems. *  Hospital Course: Leonard Burke is a 73 y.o. male with medical history significant for prediabetes, hypertension, hyperlipidemia, chronic left shoulder pain status post left reverse shoulder arthroplasty, aortic atherosclerosis, asthma, who presented to the ER, 2-hour postop, with complaints of chest pain and shortness of breath.  Associated with generalized weakness for weeks.  Patient with mild troponin elevation and subsequently reviewed by cardiologist with less concerns of ACS.  Cardiologist recommended for patient to have echo was done and showed a normal EF of 65 to 70%.  Cardiology recommended outpatient follow-up and this is being arranged.  He is medically stable for discharge but prior to discharge he did desaturate so he will be sent home on oxygen and will continue antibiotics for his pneumonia.  Will place him on cefdinir, azithromycin and Flagyl.  He will need repeat chest x-ray in 3 to 6 weeks and follow-up with his PCP within 1 week  Assessment and Plan:  Acute Hypoxic Respiratory Failure Secondary to Left Lower Lobe Pneumonia, POA -Presented with O2 saturation in the 80s, improved on 2 L nasal cannula to the mid 90s. SpO2: 95 % O2 Flow Rate (L/min): 2 L/min -Maintain O2 saturation above 92% -Continue ceftriaxone and azithromycin and changed to p.o. azithromycin,  cefdinir and add Flagyl given concern for aspiration -Pulmonary toilet -Continue bronchodilators, antitussives as needed -Continue incentive spirometer and flutter valve -CXR prior to D/C showed "Low volume film. The cardio pericardial silhouette is enlarged. Curvilinear areas of subsegmental atelectasis or scarring again noted in the parahilar left lung and to a lesser degree in the parahilar right lung. Retrocardiac opacity is similar to prior and was characterized as worrisome for pneumonia on chest CT 2 days ago. Probable small left effusion. Status post bilateral shoulder replacement. Telemetry leads overlie the chest." -Repeat chest x-ray in 3 to 6 weeks and will need outpatient follow-up and if not improving will need outpatient referral to pulmonary   Elevated Troponin, suspect Demand Ischemia in the setting of Acute Hypoxia. -Patient denies chest pains -Case was reviewed by cardiologist and according to them less concerns for ACS -Cardiologist recommends to follow-up on echocardiogram and does not recommend ACS protocol Echo performed and showed Normal EF of 65-70% and also showed The left ventricle has no regional wall motion abnormalities. There is moderate concentric left ventricular hypertrophy. Left ventricular diastolic parameters are indeterminate. Right ventricular systolic function is normal  -Continue to monitor on telemetry   QTc prolongation -Admission twelve-lead EKG with QTc 546 -Avoid QTc prolonging agents -Closely monitor on telemetry -Optimize magnesium and potassium levels.   Prediabetes with hyperglycemia -A1c 5.7 -Heart healthy carb modified diet -Monitor glucose closely   Hyperlipidemia -Resume home regimen.   Asthma -Resume home regimen and will place on flutter valve and incentive spirometer -Continue with albuterol inhaler    Hypertension -BPs are currently at goal. -Resume home oral antihypertensives as blood  pressure tolerates. -Closely monitor  vital signs. -Follow-up with PCP  Normocytic Anemia -Hgb/Hct Trend: Recent Labs  Lab 12/01/23 1420 12/01/23 1439 12/02/23 0032 12/02/23 1500 12/03/23 0344  HGB 13.5 12.9* 13.5 13.3 12.2*  HCT 39.4 38.0* 38.6* 37.9* 35.1*  MCV 90.8  --  89.1 89.4 90.5  -Check Anemia Panel in the outpatient setting -Continue to Monitor for S/Sx of Bleeding and repeat CBC within 1 week   Postop #2 status post left reverse shoulder arthroplasty -Pain control -PT OT assessment -Fall precautions.   Generalized weakness Continue PT OT and they are recommending following up with his primary surgeon and will follow-up  Class II Obesity -Complicates overall prognosis and care -Estimated body mass index is 35.56 kg/m as calculated from the following:   Height as of this encounter: 5' 9.5" (1.765 m).   Weight as of this encounter: 110.8 kg.  -Weight Loss and Dietary Counseling given  Consultants: Discussed with Cardiology Procedures performed: ECHOCARDIOGRAM Disposition: Home Diet recommendation:  Discharge Diet Orders (From admission, onward)     Start     Ordered   12/03/23 0000  Diet - low sodium heart healthy        12/03/23 1512           Cardiac diet DISCHARGE MEDICATION: Allergies as of 12/03/2023       Reactions   Codeine Nausea And Vomiting   Penicillins Rash   Tolerated Cephalosporin 12/09/21   Ivp Dye [iodinated Contrast Media] Hives   Multihance [gadobenate] Hives, Itching   Per MRI MD note; would need 13hr prep        Medication List     STOP taking these medications    Aleve PM 220-25 MG Tabs Generic drug: Naproxen Sod-diphenhydrAMINE   naproxen sodium 220 MG tablet Commonly known as: ALEVE       TAKE these medications    acetaminophen 325 MG tablet Commonly known as: TYLENOL Take 2 tablets (650 mg total) by mouth every 6 (six) hours as needed for mild pain (pain score 1-3), fever or headache.   amLODipine 10 MG tablet Commonly known as:  NORVASC Take 10 mg by mouth daily.   aspirin EC 81 MG tablet Take 1 tablet (81 mg total) by mouth daily. Swallow whole.   atenolol 50 MG tablet Commonly known as: TENORMIN Take 1 tablet (50 mg total) by mouth daily.   azithromycin 500 MG tablet Commonly known as: ZITHROMAX Take 1 tablet (500 mg total) by mouth daily for 3 days.   cefadroxil 500 MG capsule Commonly known as: DURICEF Take 1 capsule (500 mg total) by mouth 2 (two) times daily for 4 days.   diphenhydramine-acetaminophen 25-500 MG Tabs tablet Commonly known as: TYLENOL PM Take 1 tablet by mouth at bedtime as needed (Sleep related symptoms).   Eye Drops 0.012-0.2 % Soln Generic drug: Naphazoline-Polyethyl Glycol Place 1 drop into both eyes daily as needed (Allergies/dry eyes).   fenofibrate 145 MG tablet Commonly known as: TRICOR Take 1 tablet (145 mg total) by mouth daily.   Fluticasone-Salmeterol 113-14 MCG/ACT Aepb Inhale 1 puff into the lungs 2 (two) times daily.   guaiFENesin 600 MG 12 hr tablet Commonly known as: MUCINEX Take 1 tablet (600 mg total) by mouth 2 (two) times daily for 5 days.   metroNIDAZOLE 500 MG tablet Commonly known as: FLAGYL Take 1 tablet (500 mg total) by mouth every 8 (eight) hours for 5 days.   polyethylene glycol 17 g packet Commonly known as: MIRALAX /  GLYCOLAX Take 17 g by mouth daily as needed for mild constipation.   rosuvastatin 5 MG tablet Commonly known as: CRESTOR Take 5 mg by mouth daily.       Discharge Exam: Filed Weights   12/02/23 1419  Weight: 110.8 kg   Vitals:   12/03/23 0453 12/03/23 1357  BP: (!) 104/58 120/74  Pulse: 85 81  Resp: 18 16  Temp: 97.7 F (36.5 C) 98.5 F (36.9 C)  SpO2: 96% 95%   Examination: Physical Exam:  Constitutional: WN/WD obese Caucasian male in no acute distress Respiratory: Diminished to auscultation bilaterally bilaterally with coarse breath sounds worse on the left compared to right and no appreciable wheezing or  rales.  Does have some slight rhonchi., Normal respiratory effort and patient is not tachypenic. No accessory muscle use.  Wearing supplemental oxygen via nasal cannula Cardiovascular: RRR, no murmurs / rubs / gallops. S1 and S2 auscultated.  Mild extremity edema Abdomen: Soft, non-tender, distended secondary body habitus. Bowel sounds positive.  GU: Deferred. Musculoskeletal: Left Holderness is a sling Skin: No rashes, lesions, ulcers on a limited skin evaluation. No induration; Warm and dry.  Neurologic: CN 2-12 grossly intact with no focal deficits.  Romberg sign and cerebellar reflexes not assessed.  Psychiatric: Normal judgment and insight. Alert and oriented x 3. Normal mood and appropriate affect.   Condition at discharge: stable  The results of significant diagnostics from this hospitalization (including imaging, microbiology, ancillary and laboratory) are listed below for reference.   Imaging Studies: DG CHEST PORT 1 VIEW Result Date: 12/03/2023 CLINICAL DATA:  Shortness of breath. EXAM: PORTABLE CHEST 1 VIEW COMPARISON:  Chest x-ray and chest CT 12/01/2023 FINDINGS: Low volume film. The cardio pericardial silhouette is enlarged. Curvilinear areas of subsegmental atelectasis or scarring again noted in the parahilar left lung and to a lesser degree in the parahilar right lung. Retrocardiac opacity is similar to prior and was characterized as worrisome for pneumonia on chest CT 2 days ago. Probable small left effusion. Status post bilateral shoulder replacement. Telemetry leads overlie the chest. IMPRESSION: 1. Low volume film with retrocardiac opacity, similar to prior and characterized as worrisome for pneumonia on chest CT 2 days ago. 2. Probable small left effusion. Electronically Signed   By: Kennith Center M.D.   On: 12/03/2023 13:30   ECHOCARDIOGRAM COMPLETE Result Date: 12/02/2023    ECHOCARDIOGRAM REPORT   Patient Name:   KAYDENCE BABA Date of Exam: 12/02/2023 Medical Rec #:   [id number]         Height:       70.0 in Accession #:    [id number]        Weight:       220.0 lb Date of Birth:  12-04-1950         BSA:          2.174 m Patient Age:    73 years          BP: Patient Gender: M                 HR:           78 bpm. Exam Location:  Inpatient Procedure: 2D Echo, Color Doppler and Cardiac Doppler Indications:    chest pain  Sonographer:    Delcie Roch RDCS Referring Phys: [id number] CAROLE N HALL IMPRESSIONS  1. Left ventricular ejection fraction, by estimation, is 65 to 70%. The left ventricle has normal function. The left ventricle has no regional wall motion abnormalities.  There is moderate concentric left ventricular hypertrophy. Left ventricular diastolic parameters are indeterminate.  2. Right ventricular systolic function is normal. The right ventricular size is normal.  3. The mitral valve is normal in structure. No evidence of mitral valve regurgitation. No evidence of mitral stenosis.  4. The aortic valve is tricuspid. Aortic valve regurgitation is trivial. Aortic valve sclerosis/calcification is present, without any evidence of aortic stenosis.  5. The inferior vena cava is normal in size with greater than 50% respiratory variability, suggesting right atrial pressure of 3 mmHg. FINDINGS  Left Ventricle: Left ventricular ejection fraction, by estimation, is 65 to 70%. The left ventricle has normal function. The left ventricle has no regional wall motion abnormalities. The left ventricular internal cavity size was normal in size. There is  moderate concentric left ventricular hypertrophy. Left ventricular diastolic parameters are indeterminate. Right Ventricle: The right ventricular size is normal. No increase in right ventricular wall thickness. Right ventricular systolic function is normal. Left Atrium: Left atrial size was normal in size. Right Atrium: Right atrial size was normal in size. Pericardium: There is no evidence of pericardial effusion. Presence of epicardial  fat layer. Mitral Valve: The mitral valve is normal in structure. No evidence of mitral valve regurgitation. No evidence of mitral valve stenosis. Tricuspid Valve: The tricuspid valve is normal in structure. Tricuspid valve regurgitation is not demonstrated. No evidence of tricuspid stenosis. Aortic Valve: The aortic valve is tricuspid. Aortic valve regurgitation is trivial. Aortic valve sclerosis/calcification is present, without any evidence of aortic stenosis. Pulmonic Valve: The pulmonic valve was not well visualized. Pulmonic valve regurgitation is mild. No evidence of pulmonic stenosis. Aorta: The aortic root is normal in size and structure. Venous: The inferior vena cava is normal in size with greater than 50% respiratory variability, suggesting right atrial pressure of 3 mmHg. IAS/Shunts: No atrial level shunt detected by color flow Doppler.  LEFT VENTRICLE PLAX 2D LVIDd:         4.70 cm   Diastology LVIDs:         3.10 cm   LV e' medial:    5.55 cm/s LV PW:         1.50 cm   LV E/e' medial:  13.8 LV IVS:        1.30 cm   LV e' lateral:   7.94 cm/s LVOT diam:     2.50 cm   LV E/e' lateral: 9.6 LV SV:         109 LV SV Index:   50 LVOT Area:     4.91 cm  RIGHT VENTRICLE             IVC RV Basal diam:  3.00 cm     IVC diam: 1.70 cm RV S prime:     12.10 cm/s TAPSE (M-mode): 1.6 cm LEFT ATRIUM             Index        RIGHT ATRIUM           Index LA diam:        3.80 cm 1.75 cm/m   RA Area:     17.40 cm LA Vol (A2C):   54.8 ml 25.21 ml/m  RA Volume:   42.10 ml  19.37 ml/m LA Vol (A4C):   54.8 ml 25.21 ml/m LA Biplane Vol: 55.4 ml 25.49 ml/m  AORTIC VALVE LVOT Vmax:   106.00 cm/s LVOT Vmean:  74.000 cm/s LVOT VTI:    0.223 m  AORTA Ao Root diam: 4.30 cm Ao Asc diam:  4.10 cm MITRAL VALVE MV Area (PHT): 4.15 cm    SHUNTS MV Decel Time: 183 msec    Systemic VTI:  0.22 m MV E velocity: 76.50 cm/s  Systemic Diam: 2.50 cm MV A velocity: 78.40 cm/s MV E/A ratio:  0.98 Kardie Tobb DO Electronically signed by  Thomasene Ripple DO Signature Date/Time: 12/02/2023/12:40:45 PM    Final    CT Angio Chest Pulmonary Embolism (PE) W or WO Contrast Result Date: 12/01/2023 CLINICAL DATA:  High probability for PE. EXAM: CT ANGIOGRAPHY CHEST WITH CONTRAST TECHNIQUE: Multidetector CT imaging of the chest was performed using the standard protocol during bolus administration of intravenous contrast. Multiplanar CT image reconstructions and MIPs were obtained to evaluate the vascular anatomy. RADIATION DOSE REDUCTION: This exam was performed according to the departmental dose-optimization program which includes automated exposure control, adjustment of the mA and/or kV according to patient size and/or use of iterative reconstruction technique. CONTRAST:  75mL OMNIPAQUE IOHEXOL 350 MG/ML SOLN COMPARISON:  None Available. FINDINGS: Cardiovascular: Satisfactory opacification of the pulmonary arteries to the segmental level. No evidence of pulmonary embolism. Normal heart size. No pericardial effusion. There are atherosclerotic calcifications of the aorta and coronary arteries. Mediastinum/Nodes: No enlarged mediastinal, hilar, or axillary lymph nodes. Thyroid gland, trachea, and esophagus demonstrate no significant findings. Lungs/Pleura: There is left lower lobe airspace disease/consolidation. There are bands of atelectasis in the bilateral upper lobes, right middle lobe and right lower lobe. Trachea and central airways are patent. There is no pleural effusion or pneumothorax. Upper Abdomen: Small gallstones are present. Musculoskeletal: Recent left shoulder arthroplasty changes are present. There is soft tissue swelling and air surrounding the left shoulder. Right shoulder arthroplasty is also present. No acute fractures are seen. Review of the MIP images confirms the above findings. IMPRESSION: 1. No evidence for pulmonary embolism. 2. Left lower lobe airspace disease/consolidation worrisome for pneumonia. 3. Bilateral bands of atelectasis.  4. Cholelithiasis. 5. Recent left shoulder arthroplasty changes with soft tissue swelling and air surrounding the left shoulder. 6. Aortic atherosclerosis. Aortic Atherosclerosis (ICD10-I70.0). Electronically Signed   By: Darliss Cheney M.D.   On: 12/01/2023 19:26   DG Chest Port 1 View Result Date: 12/01/2023 CLINICAL DATA:  Chest pain shoulder replacement EXAM: PORTABLE CHEST 1 VIEW COMPARISON:  None Available. FINDINGS: Partially visualized left shoulder replacement with gas in the soft tissues. Hypoventilatory changes with probable atelectasis at the left base. Cannot exclude small left effusion. Cardiac silhouette is enlarged. There is no pneumothorax. Possible scarring or atelectasis at the left apex. IMPRESSION: 1. Hypoventilatory changes with probable atelectasis at the left base. Cannot exclude small left effusion. 2. Enlarged cardiac silhouette. 3. Question atelectasis or scarring at left apex, attention on follow-up chest radiography Electronically Signed   By: Jasmine Pang M.D.   On: 12/01/2023 15:12   CT SHOULDER LEFT WO CONTRAST Result Date: 11/28/2023 CLINICAL DATA:  Chronic left shoulder pain EXAM: CT OF THE UPPER LEFT EXTREMITY WITHOUT CONTRAST TECHNIQUE: Multidetector CT imaging of the upper left extremity was performed according to the standard protocol. RADIATION DOSE REDUCTION: This exam was performed according to the departmental dose-optimization program which includes automated exposure control, adjustment of the mA and/or kV according to patient size and/or use of iterative reconstruction technique. COMPARISON:  None Available. FINDINGS: Bones/Joint/Cartilage No fracture or dislocation. Normal alignment. No joint effusion. Multiple loose bodies in the axillary recess. Severe osteoarthritis of the glenohumeral joint with subchondral sclerosis and marginal osteophytes.  Mild glenoid retroversion. Mild arthropathy of the acromioclavicular joint. Ligaments Ligaments are suboptimally  evaluated by CT. Muscles and Tendons Muscles are normal.  No muscle atrophy. Soft tissue No fluid collection or hematoma. No soft tissue mass. Visualized portions of the lung are clear. Thoracic aortic atherosclerosis. Multi vessel coronary artery atherosclerosis. IMPRESSION: 1. Severe osteoarthritis of the left glenohumeral joint. 2.  Aortic Atherosclerosis (ICD10-I70.0). Electronically Signed   By: Elige Ko M.D.   On: 11/28/2023 15:36   Microbiology: Results for orders placed or performed during the hospital encounter of 01/25/22  Surgical PCR Screen     Status: None   Collection Time: 01/25/22 10:00 AM   Specimen: Nasal Mucosa; Nasal Swab  Result Value Ref Range Status   MRSA, PCR NEGATIVE NEGATIVE Final   Staphylococcus aureus NEGATIVE NEGATIVE Final    Comment: (NOTE) The Xpert SA Assay (FDA approved for NASAL specimens in patients 83 years of age and older), is one component of a comprehensive surveillance program. It is not intended to diagnose infection nor to guide or monitor treatment. Performed at [facility], [address], [place], [place] [address]    Labs: CBC: Recent Labs  Lab 12/01/23 1420 12/01/23 1439 12/02/23 0032 12/02/23 1500 12/03/23 0344  WBC 13.3*  --  12.7* 13.8* 8.9  HGB 13.5 12.9* 13.5 13.3 12.2*  HCT 39.4 38.0* 38.6* 37.9* 35.1*  MCV 90.8  --  89.1 89.4 90.5  PLT 163  --  169 168 153   Basic Metabolic Panel: Recent Labs  Lab 12/01/23 1419 12/01/23 1439 12/01/23 2043 12/02/23 0032 12/03/23 0344  NA 134* 137  --  135 138  K 3.9 4.0  --  4.2 4.2  CL 98 103  --  99 104  CO2 23  --   --  23 24  GLUCOSE 194* 198*  --  198* 142*  BUN 22 25*  --  23 26*  CREATININE 1.21 1.20 1.21 1.23 1.07  CALCIUM 9.1  --   --  9.2 8.7*  MG  --   --   --  2.1  --   PHOS  --   --   --  3.4  --    Liver Function Tests: No results for input(s): "AST", "ALT", "ALKPHOS", "BILITOT", "PROT", "ALBUMIN" in the last 168 hours. CBG: Recent  Labs  Lab 12/02/23 1334 12/02/23 1736 12/02/23 2115 12/03/23 0758 12/03/23 1216  GLUCAP 142* 139* 127* 153* 162*   Discharge time spent: greater than 30 minutes.  Signed: Marguerita Merles, DO [facility] 12/05/2023

## 2023-12-13 ENCOUNTER — Ambulatory Visit: Payer: Medicare Other | Attending: Cardiology | Admitting: Cardiology

## 2023-12-13 ENCOUNTER — Encounter: Payer: Self-pay | Admitting: Cardiology

## 2023-12-13 VITALS — BP 120/70 | HR 61 | Ht 69.0 in | Wt 239.6 lb

## 2023-12-13 DIAGNOSIS — E782 Mixed hyperlipidemia: Secondary | ICD-10-CM

## 2023-12-13 DIAGNOSIS — R7303 Prediabetes: Secondary | ICD-10-CM

## 2023-12-13 DIAGNOSIS — I10 Essential (primary) hypertension: Secondary | ICD-10-CM

## 2023-12-13 DIAGNOSIS — E781 Pure hyperglyceridemia: Secondary | ICD-10-CM

## 2023-12-13 DIAGNOSIS — R0789 Other chest pain: Secondary | ICD-10-CM

## 2023-12-13 NOTE — Patient Instructions (Signed)
 Medication Instructions:  Your physician recommends that you continue on your current medications as directed. Please refer to the Current Medication list given to you today.  *If you need a refill on your cardiac medications before your next appointment, please call your pharmacy*   Follow-Up: At [facility], you and your health needs are our priority.  As part of our continuing mission to provide you with exceptional heart care, we have created designated Provider Care Teams.  These Care Teams include your primary Cardiologist (physician) and Advanced Practice Providers (APPs -  Physician Assistants and Nurse Practitioners) who all work together to provide you with the care you need, when you need it.   Your next appointment:   5 month(s)  Provider:   Kardie Tobb, DO

## 2023-12-13 NOTE — Progress Notes (Signed)
 Cardiology Office Note:    Date:  12/13/2023   ID:  Leonard Burke, DOB Apr 03, 1951, MRN [id number]  PCP:  Leonard Senior, MD  Cardiologist:  None  Electrophysiologist:  None   Referring MD: Leonard Senior, MD     History of Present Illness:    Leonard Burke is a 73 y.o. male with a hx of prediabetes, hypertension, hyperlipidemia who was recently underwent left reverse shoulder arthroplasty.  Postoperatively he presented to the hospital for evaluation for complaints of chest pain and shortness of breath.  He was noted to have a pneumonia he was treated.  During the time of his hospitalization he had mild troponin elevation at which time he had an echocardiogram that was normal.  Today he is with his sister.  He offers no complaints.  He tells me the only pain that he feels is in his shoulder.  He is still limits sling.  He is looking forward to getting out of days in a week or so.  No other complaints at this time.  Past Medical History:  Diagnosis Date   Arthritis    Asthma    HTN (hypertension)    Hypercholesteremia    Hypertriglyceridemia     Past Surgical History:  Procedure Laterality Date   COLONOSCOPY     CYST EXCISION     KNEE ARTHROSCOPY     SHOULDER ARTHROSCOPY Bilateral    TOTAL KNEE ARTHROPLASTY Left 12/09/2021   Procedure: TOTAL KNEE ARTHROPLASTY;  Surgeon: Leonard Cough, MD;  Location: [facility];  Service: Orthopedics;  Laterality: Left;   TOTAL KNEE ARTHROPLASTY Right 02/03/2022   Procedure: TOTAL KNEE ARTHROPLASTY;  Surgeon: Leonard Cough, MD;  Location: [facility];  Service: Orthopedics;  Laterality: Right;    Current Medications: Current Meds  Medication Sig   acetaminophen  (TYLENOL ) 325 MG tablet Take 2 tablets (650 mg total) by mouth every 6 (six) hours as needed for mild pain (pain score 1-3), fever or headache.   amLODipine  (NORVASC ) 10 MG tablet Take 10 mg by mouth daily.   aspirin  EC 81 MG tablet Take 1 tablet (81 mg total) by mouth daily. Swallow  whole.   atenolol  (TENORMIN ) 50 MG tablet Take 1 tablet (50 mg total) by mouth daily.   diphenhydramine -acetaminophen  (TYLENOL  PM) 25-500 MG TABS tablet Take 1 tablet by mouth at bedtime as needed (Sleep related symptoms).   fenofibrate  (TRICOR ) 145 MG tablet Take 1 tablet (145 mg total) by mouth daily.   Fluticasone-Salmeterol 113-14 MCG/ACT AEPB Inhale 1 puff into the lungs 2 (two) times daily.   Naphazoline-Polyethyl Glycol (EYE DROPS) 0.012-0.2 % SOLN Place 1 drop into both eyes daily as needed (Allergies/dry eyes).   polyethylene glycol (MIRALAX  / GLYCOLAX ) 17 g packet Take 17 g by mouth daily as needed for mild constipation.   rosuvastatin  (CRESTOR ) 5 MG tablet Take 5 mg by mouth daily.     Allergies:   Codeine, Penicillins, Ivp dye [iodinated contrast media], and Multihance  [gadobenate]   Social History   Socioeconomic History   Marital status: Married    Spouse name: Not on file   Number of children: Not on file   Years of education: Not on file   Highest education level: Not on file  Occupational History   Not on file  Tobacco Use   Smoking status: Former    Types: Cigarettes   Smokeless tobacco: Never  Vaping Use   Vaping status: Never Used  Substance and Sexual Activity   Alcohol use: Not Currently  Comment: Occasional   Drug use: No   Sexual activity: Not Currently  Other Topics Concern   Not on file  Social History Narrative   Not on file   Social Drivers of Health   Financial Resource Strain: Not on file  Food Insecurity: No Food Insecurity (12/02/2023)   Hunger Vital Sign    Worried About Running Out of Food in the Last Year: Never true    Ran Out of Food in the Last Year: Never true  Transportation Needs: No Transportation Needs (12/02/2023)   PRAPARE - Administrator, Civil Service (Medical): No    Lack of Transportation (Non-Medical): No  Physical Activity: Not on file  Stress: Not on file  Social Connections: Socially Isolated  (12/02/2023)   Social Connection and Isolation Panel [NHANES]    Frequency of Communication with Friends and Family: Three times a week    Frequency of Social Gatherings with Friends and Family: Once a week    Attends Religious Services: Never    Database Administrator or Organizations: No    Attends Banker Meetings: Never    Marital Status: Divorced     Family History: The patient's family history is negative for Other.  ROS:   Review of Systems  Constitution: Negative for decreased appetite, fever and weight gain.  HENT: Negative for congestion, ear discharge, hoarse voice and sore throat.   Eyes: Negative for discharge, redness, vision loss in right eye and visual halos.  Cardiovascular: Negative for chest pain, dyspnea on exertion, leg swelling, orthopnea and palpitations.  Respiratory: Negative for Burke, hemoptysis, shortness of breath and snoring.   Endocrine: Negative for heat intolerance and polyphagia.  Hematologic/Lymphatic: Negative for bleeding problem. Does not bruise/bleed easily.  Skin: Negative for flushing, nail changes, rash and suspicious lesions.  Musculoskeletal: Negative for arthritis, joint pain, muscle cramps, myalgias, neck pain and stiffness.  Gastrointestinal: Negative for abdominal pain, bowel incontinence, diarrhea and excessive appetite.  Genitourinary: Negative for decreased libido, genital sores and incomplete emptying.  Neurological: Negative for brief paralysis, focal weakness, headaches and loss of balance.  Psychiatric/Behavioral: Negative for altered mental status, depression and suicidal ideas.  Allergic/Immunologic: Negative for HIV exposure and persistent infections.    EKGs/Labs/Other Studies Reviewed:    The following studies were reviewed today:   EKG:  The ekg ordered today demonstrates   Recent Labs: 12/01/2023: B Natriuretic Peptide 51.5 12/02/2023: Magnesium 2.1 12/03/2023: BUN 26; Creatinine, Ser 1.07; Hemoglobin  12.2; Platelets 153; Potassium 4.2; Sodium 138  Recent Lipid Panel    Component Value Date/Time   CHOL 166 12/02/2023 0032   TRIG 405 (H) 12/02/2023 0032   HDL 29 (L) 12/02/2023 0032   CHOLHDL 5.7 12/02/2023 0032   VLDL UNABLE TO CALCULATE IF TRIGLYCERIDE OVER 400 mg/dL 98/74/7974 9967   LDLCALC UNABLE TO CALCULATE IF TRIGLYCERIDE OVER 400 mg/dL 98/74/7974 9967   LDLDIRECT 94 12/02/2023 0032    Physical Exam:    VS:  Pulse 61   Ht 5' 9 (1.753 m)   Wt 239 lb 9.6 oz (108.7 kg)   SpO2 97%   BMI 35.38 kg/m     Wt Readings from Last 3 Encounters:  12/13/23 239 lb 9.6 oz (108.7 kg)  12/02/23 244 lb 4.8 oz (110.8 kg)  02/03/22 220 lb (99.8 kg)     GEN: Well nourished, well developed in no acute distress HEENT: Normal NECK: No JVD; No carotid bruits LYMPHATICS: No lymphadenopathy CARDIAC: S1S2 noted,RRR, no murmurs, rubs,  gallops RESPIRATORY:  Clear to auscultation without rales, wheezing or rhonchi  ABDOMEN: Soft, non-tender, non-distended, +bowel sounds, no guarding. EXTREMITIES: No edema, No cyanosis, no clubbing MUSCULOSKELETAL:  No deformity  SKIN: Warm and dry NEUROLOGIC:  Alert and oriented x 3, non-focal PSYCHIATRIC:  Normal affect, good insight  ASSESSMENT:    1. Primary hypertension   2. Hypertriglyceridemia   3. Prediabetes   4. Mixed hyperlipidemia   5. Atypical chest pain    PLAN:    Chart reviewed.  His echocardiogram was normal.  Clinically he does not have any symptoms of angina.  There is no need to pursue an ischemic evaluation on this patient at this time.  He is hypertensive in the office, but he tells me that his blood pressure at home was 120/78.  Prediabetes - lifestyle modification advised.   The patient understands the need to lose weight with diet and exercise. We have discussed specific strategies for this.  The patient is in agreement with the above plan. The patient left the office in stable condition.  The patient will follow up  in   Medication Adjustments/Labs and Tests Ordered: Current medicines are reviewed at length with the patient today.  Concerns regarding medicines are outlined above.  No orders of the defined types were placed in this encounter.  No orders of the defined types were placed in this encounter.   There are no Patient Instructions on file for this visit.   Adopting a Healthy Lifestyle.  Know what a healthy weight is for you (roughly BMI <25) and aim to maintain this   Aim for 7+ servings of fruits and vegetables daily   65-80+ fluid ounces of water  or unsweet tea for healthy kidneys   Limit to max 1 drink of alcohol per day; avoid smoking/tobacco   Limit animal fats in diet for cholesterol and heart health - choose grass fed whenever available   Avoid highly processed foods, and foods high in saturated/trans fats   Aim for low stress - take time to unwind and care for your mental health   Aim for 150 min of moderate intensity exercise weekly for heart health, and weights twice weekly for bone health   Aim for 7-9 hours of sleep daily   When it comes to diets, agreement about the perfect plan isnt easy to find, even among the experts. Experts at the [facility] [employer] developed an idea known as the Healthy Eating Plate. Just imagine a plate divided into logical, healthy portions.   The emphasis is on diet quality:   Load up on vegetables and fruits - one-half of your plate: Aim for color and variety, and remember that potatoes dont count.   Go for whole grains - one-quarter of your plate: Whole wheat, barley, wheat berries, quinoa, oats, brown rice, and foods made with them. If you want pasta, go with whole wheat pasta.   Protein power - one-quarter of your plate: Fish, chicken, beans, and nuts are all healthy, versatile protein sources. Limit red meat.   The diet, however, does go beyond the plate, offering a few other suggestions.   Use healthy plant oils,  such as olive, canola, soy, corn, sunflower and peanut. Check the labels, and avoid partially hydrogenated oil, which have unhealthy trans fats.   If youre thirsty, drink water . Coffee and tea are good in moderation, but skip sugary drinks and limit milk and dairy products to one or two daily servings.   The type of  carbohydrate in the diet is more important than the amount. Some sources of carbohydrates, such as vegetables, fruits, whole grains, and beans-are healthier than others.   Finally, stay active  Signed, Reis Pienta, DO  12/13/2023 2:16 PM    [facility]

## 2023-12-14 DIAGNOSIS — I10 Essential (primary) hypertension: Secondary | ICD-10-CM | POA: Diagnosis not present

## 2023-12-14 DIAGNOSIS — J189 Pneumonia, unspecified organism: Secondary | ICD-10-CM | POA: Diagnosis not present

## 2023-12-18 DIAGNOSIS — Z96612 Presence of left artificial shoulder joint: Secondary | ICD-10-CM | POA: Diagnosis not present

## 2024-01-01 DIAGNOSIS — M25612 Stiffness of left shoulder, not elsewhere classified: Secondary | ICD-10-CM | POA: Diagnosis not present

## 2024-01-01 DIAGNOSIS — M25512 Pain in left shoulder: Secondary | ICD-10-CM | POA: Diagnosis not present

## 2024-01-01 DIAGNOSIS — M6281 Muscle weakness (generalized): Secondary | ICD-10-CM | POA: Diagnosis not present

## 2024-01-08 DIAGNOSIS — M6281 Muscle weakness (generalized): Secondary | ICD-10-CM | POA: Diagnosis not present

## 2024-01-08 DIAGNOSIS — M25612 Stiffness of left shoulder, not elsewhere classified: Secondary | ICD-10-CM | POA: Diagnosis not present

## 2024-01-08 DIAGNOSIS — M25512 Pain in left shoulder: Secondary | ICD-10-CM | POA: Diagnosis not present

## 2024-01-12 DIAGNOSIS — J189 Pneumonia, unspecified organism: Secondary | ICD-10-CM | POA: Diagnosis not present

## 2024-01-12 DIAGNOSIS — Z96611 Presence of right artificial shoulder joint: Secondary | ICD-10-CM | POA: Diagnosis not present

## 2024-01-12 DIAGNOSIS — M6281 Muscle weakness (generalized): Secondary | ICD-10-CM | POA: Diagnosis not present

## 2024-01-12 DIAGNOSIS — M25512 Pain in left shoulder: Secondary | ICD-10-CM | POA: Diagnosis not present

## 2024-01-12 DIAGNOSIS — Z96612 Presence of left artificial shoulder joint: Secondary | ICD-10-CM | POA: Diagnosis not present

## 2024-01-12 DIAGNOSIS — M25612 Stiffness of left shoulder, not elsewhere classified: Secondary | ICD-10-CM | POA: Diagnosis not present

## 2024-01-15 DIAGNOSIS — M6281 Muscle weakness (generalized): Secondary | ICD-10-CM | POA: Diagnosis not present

## 2024-01-15 DIAGNOSIS — M25612 Stiffness of left shoulder, not elsewhere classified: Secondary | ICD-10-CM | POA: Diagnosis not present

## 2024-01-15 DIAGNOSIS — M25512 Pain in left shoulder: Secondary | ICD-10-CM | POA: Diagnosis not present

## 2024-01-18 DIAGNOSIS — M25612 Stiffness of left shoulder, not elsewhere classified: Secondary | ICD-10-CM | POA: Diagnosis not present

## 2024-01-18 DIAGNOSIS — M25512 Pain in left shoulder: Secondary | ICD-10-CM | POA: Diagnosis not present

## 2024-01-18 DIAGNOSIS — M6281 Muscle weakness (generalized): Secondary | ICD-10-CM | POA: Diagnosis not present

## 2024-01-22 DIAGNOSIS — M25512 Pain in left shoulder: Secondary | ICD-10-CM | POA: Diagnosis not present

## 2024-01-22 DIAGNOSIS — M6281 Muscle weakness (generalized): Secondary | ICD-10-CM | POA: Diagnosis not present

## 2024-01-22 DIAGNOSIS — M25612 Stiffness of left shoulder, not elsewhere classified: Secondary | ICD-10-CM | POA: Diagnosis not present

## 2024-01-26 DIAGNOSIS — M6281 Muscle weakness (generalized): Secondary | ICD-10-CM | POA: Diagnosis not present

## 2024-01-26 DIAGNOSIS — M25612 Stiffness of left shoulder, not elsewhere classified: Secondary | ICD-10-CM | POA: Diagnosis not present

## 2024-01-26 DIAGNOSIS — M25512 Pain in left shoulder: Secondary | ICD-10-CM | POA: Diagnosis not present

## 2024-01-29 DIAGNOSIS — M25612 Stiffness of left shoulder, not elsewhere classified: Secondary | ICD-10-CM | POA: Diagnosis not present

## 2024-01-29 DIAGNOSIS — M25512 Pain in left shoulder: Secondary | ICD-10-CM | POA: Diagnosis not present

## 2024-01-29 DIAGNOSIS — M6281 Muscle weakness (generalized): Secondary | ICD-10-CM | POA: Diagnosis not present

## 2024-02-02 DIAGNOSIS — M25612 Stiffness of left shoulder, not elsewhere classified: Secondary | ICD-10-CM | POA: Diagnosis not present

## 2024-02-02 DIAGNOSIS — M25512 Pain in left shoulder: Secondary | ICD-10-CM | POA: Diagnosis not present

## 2024-02-02 DIAGNOSIS — M6281 Muscle weakness (generalized): Secondary | ICD-10-CM | POA: Diagnosis not present

## 2024-02-05 DIAGNOSIS — M25612 Stiffness of left shoulder, not elsewhere classified: Secondary | ICD-10-CM | POA: Diagnosis not present

## 2024-02-05 DIAGNOSIS — M25512 Pain in left shoulder: Secondary | ICD-10-CM | POA: Diagnosis not present

## 2024-02-05 DIAGNOSIS — M6281 Muscle weakness (generalized): Secondary | ICD-10-CM | POA: Diagnosis not present

## 2024-02-08 DIAGNOSIS — M25512 Pain in left shoulder: Secondary | ICD-10-CM | POA: Diagnosis not present

## 2024-02-08 DIAGNOSIS — M25612 Stiffness of left shoulder, not elsewhere classified: Secondary | ICD-10-CM | POA: Diagnosis not present

## 2024-02-08 DIAGNOSIS — M6281 Muscle weakness (generalized): Secondary | ICD-10-CM | POA: Diagnosis not present

## 2024-02-19 DIAGNOSIS — M25612 Stiffness of left shoulder, not elsewhere classified: Secondary | ICD-10-CM | POA: Diagnosis not present

## 2024-02-19 DIAGNOSIS — M6281 Muscle weakness (generalized): Secondary | ICD-10-CM | POA: Diagnosis not present

## 2024-02-19 DIAGNOSIS — M25512 Pain in left shoulder: Secondary | ICD-10-CM | POA: Diagnosis not present

## 2024-02-22 DIAGNOSIS — M25612 Stiffness of left shoulder, not elsewhere classified: Secondary | ICD-10-CM | POA: Diagnosis not present

## 2024-02-22 DIAGNOSIS — M25512 Pain in left shoulder: Secondary | ICD-10-CM | POA: Diagnosis not present

## 2024-02-22 DIAGNOSIS — M6281 Muscle weakness (generalized): Secondary | ICD-10-CM | POA: Diagnosis not present

## 2024-02-27 DIAGNOSIS — M6281 Muscle weakness (generalized): Secondary | ICD-10-CM | POA: Diagnosis not present

## 2024-02-27 DIAGNOSIS — M25512 Pain in left shoulder: Secondary | ICD-10-CM | POA: Diagnosis not present

## 2024-02-27 DIAGNOSIS — M25612 Stiffness of left shoulder, not elsewhere classified: Secondary | ICD-10-CM | POA: Diagnosis not present

## 2024-02-28 DIAGNOSIS — Z96612 Presence of left artificial shoulder joint: Secondary | ICD-10-CM | POA: Diagnosis not present

## 2024-02-29 DIAGNOSIS — M25612 Stiffness of left shoulder, not elsewhere classified: Secondary | ICD-10-CM | POA: Diagnosis not present

## 2024-02-29 DIAGNOSIS — M25512 Pain in left shoulder: Secondary | ICD-10-CM | POA: Diagnosis not present

## 2024-02-29 DIAGNOSIS — M6281 Muscle weakness (generalized): Secondary | ICD-10-CM | POA: Diagnosis not present

## 2024-03-05 DIAGNOSIS — M25512 Pain in left shoulder: Secondary | ICD-10-CM | POA: Diagnosis not present

## 2024-03-05 DIAGNOSIS — M25612 Stiffness of left shoulder, not elsewhere classified: Secondary | ICD-10-CM | POA: Diagnosis not present

## 2024-03-05 DIAGNOSIS — M6281 Muscle weakness (generalized): Secondary | ICD-10-CM | POA: Diagnosis not present

## 2024-03-07 DIAGNOSIS — M25612 Stiffness of left shoulder, not elsewhere classified: Secondary | ICD-10-CM | POA: Diagnosis not present

## 2024-03-07 DIAGNOSIS — M6281 Muscle weakness (generalized): Secondary | ICD-10-CM | POA: Diagnosis not present

## 2024-03-07 DIAGNOSIS — M25512 Pain in left shoulder: Secondary | ICD-10-CM | POA: Diagnosis not present

## 2024-03-11 DIAGNOSIS — M25612 Stiffness of left shoulder, not elsewhere classified: Secondary | ICD-10-CM | POA: Diagnosis not present

## 2024-03-11 DIAGNOSIS — M6281 Muscle weakness (generalized): Secondary | ICD-10-CM | POA: Diagnosis not present

## 2024-03-11 DIAGNOSIS — M25512 Pain in left shoulder: Secondary | ICD-10-CM | POA: Diagnosis not present

## 2024-03-14 DIAGNOSIS — M6281 Muscle weakness (generalized): Secondary | ICD-10-CM | POA: Diagnosis not present

## 2024-03-14 DIAGNOSIS — M25612 Stiffness of left shoulder, not elsewhere classified: Secondary | ICD-10-CM | POA: Diagnosis not present

## 2024-03-14 DIAGNOSIS — M25512 Pain in left shoulder: Secondary | ICD-10-CM | POA: Diagnosis not present

## 2024-04-22 DIAGNOSIS — H34831 Tributary (branch) retinal vein occlusion, right eye, with macular edema: Secondary | ICD-10-CM | POA: Diagnosis not present

## 2024-04-22 DIAGNOSIS — H43822 Vitreomacular adhesion, left eye: Secondary | ICD-10-CM | POA: Diagnosis not present

## 2024-04-22 DIAGNOSIS — H353112 Nonexudative age-related macular degeneration, right eye, intermediate dry stage: Secondary | ICD-10-CM | POA: Diagnosis not present

## 2024-04-22 DIAGNOSIS — D3131 Benign neoplasm of right choroid: Secondary | ICD-10-CM | POA: Diagnosis not present

## 2024-05-01 DIAGNOSIS — J45909 Unspecified asthma, uncomplicated: Secondary | ICD-10-CM | POA: Diagnosis not present

## 2024-05-01 DIAGNOSIS — Z0000 Encounter for general adult medical examination without abnormal findings: Secondary | ICD-10-CM | POA: Diagnosis not present

## 2024-05-01 DIAGNOSIS — E782 Mixed hyperlipidemia: Secondary | ICD-10-CM | POA: Diagnosis not present

## 2024-05-01 DIAGNOSIS — I10 Essential (primary) hypertension: Secondary | ICD-10-CM | POA: Diagnosis not present

## 2024-05-07 DIAGNOSIS — K080 Exfoliation of teeth due to systemic causes: Secondary | ICD-10-CM | POA: Diagnosis not present

## 2024-06-11 DIAGNOSIS — L578 Other skin changes due to chronic exposure to nonionizing radiation: Secondary | ICD-10-CM | POA: Diagnosis not present

## 2024-06-11 DIAGNOSIS — L814 Other melanin hyperpigmentation: Secondary | ICD-10-CM | POA: Diagnosis not present

## 2024-06-11 DIAGNOSIS — D229 Melanocytic nevi, unspecified: Secondary | ICD-10-CM | POA: Diagnosis not present

## 2024-06-11 DIAGNOSIS — L570 Actinic keratosis: Secondary | ICD-10-CM | POA: Diagnosis not present

## 2024-06-11 DIAGNOSIS — L820 Inflamed seborrheic keratosis: Secondary | ICD-10-CM | POA: Diagnosis not present

## 2024-06-11 DIAGNOSIS — L821 Other seborrheic keratosis: Secondary | ICD-10-CM | POA: Diagnosis not present

## 2024-06-20 DIAGNOSIS — C61 Malignant neoplasm of prostate: Secondary | ICD-10-CM | POA: Diagnosis not present

## 2024-06-27 DIAGNOSIS — C61 Malignant neoplasm of prostate: Secondary | ICD-10-CM | POA: Diagnosis not present

## 2024-07-04 ENCOUNTER — Encounter: Payer: Self-pay | Admitting: Urology

## 2024-07-04 ENCOUNTER — Other Ambulatory Visit: Payer: Self-pay | Admitting: Urology

## 2024-07-04 DIAGNOSIS — C61 Malignant neoplasm of prostate: Secondary | ICD-10-CM

## 2024-07-19 DIAGNOSIS — H353111 Nonexudative age-related macular degeneration, right eye, early dry stage: Secondary | ICD-10-CM | POA: Diagnosis not present

## 2024-08-14 ENCOUNTER — Ambulatory Visit
Admission: RE | Admit: 2024-08-14 | Discharge: 2024-08-14 | Disposition: A | Discharge status: Home / Self Care | Source: Ambulatory Visit | Attending: Urology | Admitting: Urology

## 2024-08-14 DIAGNOSIS — C61 Malignant neoplasm of prostate: Secondary | ICD-10-CM

## 2024-08-14 DIAGNOSIS — N400 Enlarged prostate without lower urinary tract symptoms: Secondary | ICD-10-CM | POA: Diagnosis not present

## 2024-08-14 MED ORDER — GADOPICLENOL 0.5 MMOL/ML IV SOLN
10.0000 mL | Freq: Once | INTRAVENOUS | Status: AC | PRN
  Administered 2024-08-14: 10 mL via INTRAVENOUS

## 2024-08-20 DIAGNOSIS — M5459 Other low back pain: Secondary | ICD-10-CM | POA: Diagnosis not present

## 2024-08-24 DIAGNOSIS — M545 Low back pain, unspecified: Secondary | ICD-10-CM | POA: Diagnosis not present

## 2024-08-28 DIAGNOSIS — L820 Inflamed seborrheic keratosis: Secondary | ICD-10-CM | POA: Diagnosis not present

## 2024-08-29 ENCOUNTER — Other Ambulatory Visit: Payer: Self-pay | Admitting: Anatomic Pathology

## 2024-08-29 DIAGNOSIS — C61 Malignant neoplasm of prostate: Secondary | ICD-10-CM | POA: Diagnosis not present

## 2024-08-29 DIAGNOSIS — N4232 Atypical small acinar proliferation of prostate: Secondary | ICD-10-CM | POA: Diagnosis not present

## 2024-08-29 DIAGNOSIS — N4231 Prostatic intraepithelial neoplasia: Secondary | ICD-10-CM | POA: Diagnosis not present

## 2024-08-29 DIAGNOSIS — R972 Elevated prostate specific antigen [PSA]: Secondary | ICD-10-CM | POA: Diagnosis not present

## 2024-09-03 DIAGNOSIS — M545 Low back pain, unspecified: Secondary | ICD-10-CM | POA: Diagnosis not present

## 2024-09-09 DIAGNOSIS — C61 Malignant neoplasm of prostate: Secondary | ICD-10-CM | POA: Diagnosis not present

## 2024-09-10 NOTE — Progress Notes (Unsigned)
 Office: [phone]  /  Fax: [phone]   Initial Visit    Leonard Burke was seen in clinic today to evaluate for obesity. He is interested in losing weight to improve overall health and reduce the risk of weight related complications. He presents today to review program treatment options, initial physical assessment, and evaluation.     He was referred by: {emreferby:28303}  When asked what else they would like to accomplish? He states: {EMHopetoaccomplish:28304::Adopt a healthier eating pattern and lifestyle,Improve energy levels and physical activity,Improve existing medical conditions,Improve quality of life}  When asked how has your weight affected you? He states: {EMWeightAffected:28305}  Weight history: ***  Highest weight: ***  Some associated conditions: {EMSomeConditions:28306}  Contributing factors: {EMcontributingfactors:28307}  Weight promoting medications identified: {EMWeightpromotingrx:28308}  Prior weight loss attempts: {emweightlossprograms:31590::None}  Current nutrition plan: {EMNutritionplan:28309::None}  Current level of physical activity: {EMcurrentPA:28310::None}  Current or previous pharmacotherapy: {EM previousRx:28311}  Response to medication: {EMResponsetomedication:28312}   Past medical history includes:   Past Medical History:  Diagnosis Date   Arthritis    Asthma    HTN (hypertension)    Hypercholesteremia    Hypertriglyceridemia      Objective    There were no vitals taken for this visit. He was weighed on the bioimpedance scale: There is no height or weight on file to calculate BMI.  Body Fat%:***, Visceral Fat Rating:***, Weight trend over the last 12 months: {emweighttrend:28333}  General:  Alert, oriented and cooperative. Patient is in no acute distress.  Respiratory: Normal respiratory effort, no problems with respiration noted   Gait: able to ambulate independently  Mental Status: Normal mood and  affect. Normal behavior. Normal judgment and thought content.   DIAGNOSTIC DATA REVIEWED:  BMET    Component Value Date/Time   NA 138 12/03/2023 0344   K 4.2 12/03/2023 0344   CL 104 12/03/2023 0344   CO2 24 12/03/2023 0344   GLUCOSE 142 (H) 12/03/2023 0344   BUN 26 (H) 12/03/2023 0344   CREATININE 1.07 12/03/2023 0344   CALCIUM  8.7 (L) 12/03/2023 0344   GFRNONAA >60 12/03/2023 0344   Lab Results  Component Value Date   HGBA1C 5.7 (H) 12/02/2023   No results found for: INSULIN  CBC    Component Value Date/Time   WBC 8.9 12/03/2023 0344   RBC 3.88 (L) 12/03/2023 0344   HGB 12.2 (L) 12/03/2023 0344   HCT 35.1 (L) 12/03/2023 0344   PLT 153 12/03/2023 0344   MCV 90.5 12/03/2023 0344   MCH 31.4 12/03/2023 0344   MCHC 34.8 12/03/2023 0344   RDW 13.8 12/03/2023 0344   Iron/TIBC/Ferritin/ %Sat No results found for: IRON, TIBC, FERRITIN, IRONPCTSAT Lipid Panel     Component Value Date/Time   CHOL 166 12/02/2023 0032   TRIG 405 (H) 12/02/2023 0032   HDL 29 (L) 12/02/2023 0032   CHOLHDL 5.7 12/02/2023 0032   VLDL UNABLE TO CALCULATE IF TRIGLYCERIDE OVER 400 mg/dL 98/74/7974 9967   LDLCALC UNABLE TO CALCULATE IF TRIGLYCERIDE OVER 400 mg/dL 98/74/7974 9967   LDLDIRECT 94 12/02/2023 0032   Hepatic Function Panel     Component Value Date/Time   PROT 7.1 01/25/2022 1000   ALBUMIN 4.4 01/25/2022 1000   AST 21 01/25/2022 1000   ALT 19 01/25/2022 1000   ALKPHOS 60 01/25/2022 1000   BILITOT 0.4 01/25/2022 1000      Component Value Date/Time   TSH 4.28 12/18/2017 0821     Assessment and Plan   There are no diagnoses linked to  this encounter.  Assessment and Plan Assessment & Plan         Obesity Treatment / Action Plan:  {EMobesityactionplanscribe:28314::Patient will work on garnering support from family and friends to begin weight loss journey.,Will work on eliminating or reducing the presence of highly palatable, calorie dense foods in the  home.,Will complete provided nutritional and psychosocial assessment questionnaire before the next appointment.,Will be scheduled for indirect calorimetry to determine resting energy expenditure in a fasting state.  This will allow us  to create a reduced calorie, high-protein meal plan to promote loss of fat mass while preserving muscle mass.,Counseled on the health benefits of losing 5%-15% of total body weight.,Was counseled on nutritional approaches to weight loss and benefits of reducing processed foods and consuming plant-based foods and high quality protein as part of nutritional weight management.,Was counseled on pharmacotherapy and role as an adjunct in weight management. }  Obesity Education Performed Today:  He was weighed on the bioimpedance scale and results were discussed and documented in the synopsis.  We discussed obesity as a disease and the importance of a more detailed evaluation of all the factors contributing to the disease.  We discussed the importance of long term lifestyle changes which include nutrition, exercise and behavioral modifications as well as the importance of customizing this to his specific health and social needs.  We discussed the benefits of reaching a healthier weight to alleviate the symptoms of existing conditions and reduce the risks of the biomechanical, metabolic and psychological effects of obesity.  We reviewed the four pillars of obesity medicine and importance of using a multimodal approach.  We reviewed the basic principles in weight management.   Leonard Burke appears to be in the action stage of change and states they are ready to start intensive lifestyle modifications and behavioral modifications.  I have spent *** minutes in the care of the patient today including: {NUMBER 1-10:22536} minutes before the visit reviewing and preparing the chart. *** minutes face-to-face {emfacetoface:32598::assessing and reviewing listed  medical problems as outlined in obesity care plan,providing nutritional and behavioral counseling on topics outlined in the obesity care plan,independently interpreting test results and goals of care, as described in assessment and plan,reviewing and discussing biometric information and progress} {NUMBER 1-10:22536} minutes after the visit updating chart and documentation of encounter.  Reviewed by clinician on day of visit: allergies, medications, problem list, medical history, surgical history, family history, social history, and previous encounter notes pertinent to obesity diagnosis.   Lucas Parker, MD

## 2024-09-11 ENCOUNTER — Encounter (INDEPENDENT_AMBULATORY_CARE_PROVIDER_SITE_OTHER): Payer: Self-pay

## 2024-09-11 ENCOUNTER — Encounter (INDEPENDENT_AMBULATORY_CARE_PROVIDER_SITE_OTHER): Payer: Self-pay | Admitting: Physician Assistant

## 2024-09-11 ENCOUNTER — Encounter (INDEPENDENT_AMBULATORY_CARE_PROVIDER_SITE_OTHER): Admitting: Physician Assistant

## 2024-09-11 VITALS — BP 142/76 | HR 65 | Temp 98.5°F | Ht 69.0 in | Wt 240.0 lb

## 2024-09-11 DIAGNOSIS — I10 Essential (primary) hypertension: Secondary | ICD-10-CM

## 2024-09-11 DIAGNOSIS — Z96652 Presence of left artificial knee joint: Secondary | ICD-10-CM

## 2024-09-11 DIAGNOSIS — C61 Malignant neoplasm of prostate: Secondary | ICD-10-CM

## 2024-09-11 DIAGNOSIS — M545 Low back pain, unspecified: Secondary | ICD-10-CM

## 2024-09-11 DIAGNOSIS — J9601 Acute respiratory failure with hypoxia: Secondary | ICD-10-CM

## 2024-09-11 DIAGNOSIS — Z96651 Presence of right artificial knee joint: Secondary | ICD-10-CM

## 2024-09-11 DIAGNOSIS — E781 Pure hyperglyceridemia: Secondary | ICD-10-CM

## 2024-09-12 DIAGNOSIS — M5416 Radiculopathy, lumbar region: Secondary | ICD-10-CM | POA: Diagnosis not present

## 2024-09-15 NOTE — Progress Notes (Signed)
 Patient just diagnosed with prostate cancer and unsure of what his treatment options are going to be a this point.  We discussed he could consider seeing a nutritionist at this point to encourage healthy weight loss as we both felt this may be too difficult for him to manage if he is undergoing cancer treatments.   He will call back to reschedule when he has decided what direction he wishes to pursue.   Ndia Sampath,PA-C

## 2024-09-17 ENCOUNTER — Other Ambulatory Visit (HOSPITAL_COMMUNITY): Payer: Self-pay | Admitting: Urology

## 2024-09-17 DIAGNOSIS — C61 Malignant neoplasm of prostate: Secondary | ICD-10-CM

## 2024-09-26 ENCOUNTER — Telehealth: Payer: Self-pay

## 2024-09-26 NOTE — Telephone Encounter (Signed)
 I called pt to introduce myself as  the Coordinator of the [facility].   1. I confirmed with the patient he is aware of his referral to the clinic 11/25, arriving @ 12:30 pm.    2. I discussed the format of the clinic and the physicians he will be seeing that day.   3. I discussed where the clinic is located and how to contact me.   4. I confirmed his address and informed him I would be mailing a packet of information and forms to be completed. I asked him to bring them with him the day of his appointment.    He voiced understanding of the above. I asked him to call me if he has any questions or concerns regarding his appointments or the forms he needs to complete.

## 2024-09-30 ENCOUNTER — Encounter (HOSPITAL_COMMUNITY)
Admission: RE | Admit: 2024-09-30 | Discharge: 2024-09-30 | Disposition: A | Discharge status: Home / Self Care | Source: Ambulatory Visit | Attending: Urology | Admitting: Urology

## 2024-09-30 DIAGNOSIS — C61 Malignant neoplasm of prostate: Secondary | ICD-10-CM | POA: Insufficient documentation

## 2024-09-30 LAB — SURGICAL PATHOLOGY

## 2024-09-30 MED ORDER — FLOTUFOLASTAT F 18 GALLIUM 296-5846 MBQ/ML IV SOLN
8.7400 | Freq: Once | INTRAVENOUS | Status: AC
  Administered 2024-09-30: 8.74 via INTRAVENOUS

## 2024-09-30 NOTE — Progress Notes (Unsigned)
 [facility] CONSULT NOTE  Patient Care Team: Nanci Senior, MD as PCP - General (Family Medicine) Nanci Senior, MD as Consulting Physician (Family Medicine) Vertell Pont, RN as Oncology Nurse Navigator  ASSESSMENT & PLAN:  [name] is a 73 y.o.male with history of arthritis, hypertension, hyperlipidemia, prostate adenocarcinoma being seen at [facility] for prostate cancer.  Current diagnosis: High risk Gleason 4+4 prostate adenocarcinoma.  PSA 9.2 Treatment:  His case was discussed at tumor board with multiple specialists including radiation oncologist, urology oncologist, pathologist, radiologist. The patient was counseled on the natural history of prostate cancer and the standard treatment options that are available for prostate cancer.   For low risk favorable intermediate risk, per NCCN guideline, in patients with > 10 years of life expectancy, active surveillance, RP or RT are all options resulted in excellent long term survival. For unfavorable intermediate risk, per NCCN guideline, in patients with > 10 years of life expectancy, RP + PLND or RT + ADT are both options resulted in excellent long term survival. For high risk, per NCCN guideline, in patients with > 5 years of life expectancy, RP + PLND or RT + long term ADT are both options resulted in excellent long term survival.  Treatment is recommended. Patient was encouraged to consider the side effect profiles of each treatment modality and make an informed decision for definitive treatment.  Patient will follow-up with radiation oncology or urologic oncology for definitive treatment.  He may follow-up with medical oncology as needed.  Assessment & Plan   No orders of the defined types were placed in this encounter.   Supportive baseline bone mineral density study and then every 2 years calcium  (1000-1200 mg daily from food and supplements) and vitamin D3 (1000 IU daily) Zometa (5 mg IV annually) for  osteopenia (T-score between -1.0 and -2.5) on ADT after dental clearance. If CRPC, 4 mg every 3 months if having bone metastases. Healthy lifestyle to prevent diabetes and CV disease Weight-bearing exercises (30 minutes per day) Limit alcohol consumption and avoid smoking  All questions were answered. The patient knows to call the clinic with any problems, questions or concerns. No barriers to learning was detected.  Pauletta JAYSON Chihuahua, MD 11/24/20252:29 PM  CHIEF COMPLAINTS/PURPOSE OF CONSULTATION:  prostate cancer  HISTORY OF PRESENTING ILLNESS:  Leonard Burke 73 y.o. male is here because of prostate cancer.  I have reviewed his chart and materials related to his cancer extensively and collaborated history with the patient. Summary of oncologic history is as follows:  Patient was referred from Dr. Sherwood Edison to prostate [facility].  Report history of prostate cancer diagnosed in 2021, low-volume 3+3 in 3/12 cores in 12/2019.  Patient underwent active surveillance.  Repeat biopsy in 04/2021 showed a GS 3+4 = 7 adenocarcinoma in 70% of 1 core in ROI.  3+3 in 3 other cores.  Patient continue active surveillance.  PSA from 09/2022 was 6.8.  MRI in 09/2022 showed a small PI-RADS: 4 lesion of posteromedial LEFT mid gland peripheral zone is concerning for high-grade carcinoma. A PI-RADS 3 anterior RIGHT transitional zone just off midline is concerning for prostate adenocarcinoma. Repeat biopsy on 11/28/2022 showed two 3+3 = 6 lesions.  Patient continued surveillance.  Most recent PSA was 9.2 in August 2025.  Patient repeat MRI showed a PI-RADS 5 lesion in the left posteromedial peripheral zone and a PI-RADS 4 lesion in the right anterior transition zone and right anterior peripheral zone at the apex.  Patient underwent  repeat biopsy as below show one core with 4+4 = 8 adenocarcinoma.  08/14/24 prostate MRI 1. PI-RADS category 5 lesion of the left posteromedial peripheral zone and PI-RADS category 4  lesions of the right anterior transition zone and right anterior peripheral zone at the apex. Targeting data sent to [facility]. 2. Prostatomegaly and benign prostatic hypertrophy.  08/29/2024 prostate biopsy showed highest Gleason score 4+4 = 8 over target lesion 1.  4+3 = 7 in 1 core.  3+3 = 6 in 4 cores.    He denies any urinary symptoms such as dysuria, increased frequency, hesitancy, hematuria or difficulty urinating.  Oncology History  Malignant neoplasm of prostate (HCC)  04/08/2021 Cancer Staging   Staging form: Prostate, AJCC 8th Edition - Clinical stage from 04/08/2021: Stage IIB (cT1c, cN0, cM0, PSA: 4.9, Grade Group: 2) - Signed by Sherwood Rise, PA-C on 06/23/2021 Histopathologic type: Adenocarcinoma, NOS Stage prefix: Initial diagnosis Prostate specific antigen (PSA) range: Less than 10 Gleason primary pattern: 3 Gleason secondary pattern: 4 Gleason score: 7 Histologic grading system: 5 grade system Number of biopsy cores examined: 16 Number of biopsy cores positive: 7 Location of positive needle core biopsies: Both sides   06/23/2021 Initial Diagnosis   Malignant neoplasm of prostate [facility])     MEDICAL HISTORY:  Past Medical History:  Diagnosis Date   Arthritis    Asthma    HTN (hypertension)    Hypercholesteremia    Hypertriglyceridemia     SURGICAL HISTORY: Past Surgical History:  Procedure Laterality Date   COLONOSCOPY     CYST EXCISION     KNEE ARTHROSCOPY     SHOULDER ARTHROSCOPY Bilateral    TOTAL KNEE ARTHROPLASTY Left 12/09/2021   Procedure: TOTAL KNEE ARTHROPLASTY;  Surgeon: Ernie Cough, MD;  Location: [facility];  Service: Orthopedics;  Laterality: Left;   TOTAL KNEE ARTHROPLASTY Right 02/03/2022   Procedure: TOTAL KNEE ARTHROPLASTY;  Surgeon: Ernie Cough, MD;  Location: [facility];  Service: Orthopedics;  Laterality: Right;    SOCIAL HISTORY: Social History   Socioeconomic History   Marital status: Married    Spouse name: Not on file   Number of  children: Not on file   Years of education: Not on file   Highest education level: Not on file  Occupational History   Not on file  Tobacco Use   Smoking status: Former    Types: Cigarettes   Smokeless tobacco: Never  Vaping Use   Vaping status: Never Used  Substance and Sexual Activity   Alcohol use: Not Currently    Comment: Occasional   Drug use: No   Sexual activity: Not Currently  Other Topics Concern   Not on file  Social History Narrative   Not on file   Social Drivers of Health   Financial Resource Strain: Not on file  Food Insecurity: No Food Insecurity (12/02/2023)   Hunger Vital Sign    Worried About Running Out of Food in the Last Year: Never true    Ran Out of Food in the Last Year: Never true  Transportation Needs: No Transportation Needs (12/02/2023)   PRAPARE - Administrator, Civil Service (Medical): No    Lack of Transportation (Non-Medical): No  Physical Activity: Not on file  Stress: Not on file  Social Connections: Socially Isolated (12/02/2023)   Social Connection and Isolation Panel    Frequency of Communication with Friends and Family: Three times a week    Frequency of Social Gatherings with Friends and Family: Once  a week    Attends Religious Services: Never    Active Member of Clubs or Organizations: No    Attends Banker Meetings: Never    Marital Status: Divorced  Catering Manager Violence: Not At Risk (12/02/2023)   Humiliation, Afraid, Rape, and Kick questionnaire    Fear of Current or Ex-Partner: No    Emotionally Abused: No    Physically Abused: No    Sexually Abused: No    FAMILY HISTORY: Family History  Problem Relation Age of Onset   Other Neg Hx        hypertriglyceridemia    ALLERGIES:  is allergic to codeine, penicillins, ivp dye [iodinated contrast media], and multihance  [gadobenate].  MEDICATIONS:  Current Outpatient Medications  Medication Sig Dispense Refill   acetaminophen  (TYLENOL ) 325 MG  tablet Take 2 tablets (650 mg total) by mouth every 6 (six) hours as needed for mild pain (pain score 1-3), fever or headache. 20 tablet 0   amLODipine  (NORVASC ) 10 MG tablet Take 10 mg by mouth daily.     aspirin  EC 81 MG tablet Take 1 tablet (81 mg total) by mouth daily. Swallow whole. 30 tablet 0   atenolol  (TENORMIN ) 50 MG tablet Take 1 tablet (50 mg total) by mouth daily. 30 tablet 0   diphenhydramine -acetaminophen  (TYLENOL  PM) 25-500 MG TABS tablet Take 1 tablet by mouth at bedtime as needed (Sleep related symptoms).     fenofibrate  (TRICOR ) 145 MG tablet Take 1 tablet (145 mg total) by mouth daily. 90 tablet 3   Fluticasone-Salmeterol 113-14 MCG/ACT AEPB Inhale 1 puff into the lungs 2 (two) times daily.     Naphazoline-Polyethyl Glycol (EYE DROPS) 0.012-0.2 % SOLN Place 1 drop into both eyes daily as needed (Allergies/dry eyes).     polyethylene glycol (MIRALAX  / GLYCOLAX ) 17 g packet Take 17 g by mouth daily as needed for mild constipation. 14 each 0   rosuvastatin  (CRESTOR ) 5 MG tablet Take 5 mg by mouth daily. (Patient not taking: Reported on 09/11/2024)     No current facility-administered medications for this visit.    REVIEW OF SYSTEMS:   All relevant systems were reviewed with the patient and are negative.  PHYSICAL EXAMINATION: ECOG PERFORMANCE STATUS: {CHL ONC ECOG [phone]}  There were no vitals filed for this visit. There were no vitals filed for this visit.  GENERAL: alert, no distress and comfortable SKIN: skin color is normal, no jaundice LUNGS: Effort normal, no respiratory distress.  Clear to auscultation bilaterally HEART: regular rate & rhythm and no lower extremity edema ABDOMEN: soft, non-tender and nondistended Musculoskeletal: no point tenderness  LABORATORY & PATHOLOGY DATA:  I have reviewed the results of labs, PSA and biopsy results related to his cancer.  RADIOGRAPHIC STUDIES: I have reviewed the radiological images related to his cancer during  tumor board meeting.

## 2024-09-30 NOTE — Progress Notes (Unsigned)
                               Care Plan Summary  Name: Leonard Burke  DOB: 1951/02/11   Your Medical Team:   Urologist -  Dr. Gretel Ferrara, [facility]  Radiation Oncologist - Dr. Donnice Barge, [facility]   Medical Oncologist - Dr. Pauletta Chihuahua, [facility]  Recommendations: 1) LT-ADT (Androgen Deprivation Therapy) and 8 weeks of daily radiation.   * These recommendations are based on information available as of today's consult.      Recommendations may change depending on the results of further tests or exams.     When appointments need to be scheduled, you will be contacted by [facility] and/or [facility].  Questions?  Please do not hesitate to call Vertell Pont, BSN, RN at [phone] with any questions or concerns.  Vertell is your Oncology Nurse Navigator and is available to assist you while you're receiving your medical care at [facility].

## 2024-09-30 NOTE — Progress Notes (Signed)
RN spoke with patient and confirmed upcoming Omer appointment.

## 2024-10-01 ENCOUNTER — Encounter: Payer: Self-pay | Admitting: Radiation Oncology

## 2024-10-01 ENCOUNTER — Ambulatory Visit
Admission: RE | Admit: 2024-10-01 | Discharge: 2024-10-01 | Disposition: A | Discharge status: Home / Self Care | Source: Ambulatory Visit | Attending: Radiation Oncology | Admitting: Radiation Oncology

## 2024-10-01 ENCOUNTER — Inpatient Hospital Stay

## 2024-10-01 VITALS — BP 152/71 | HR 73 | Temp 97.9°F | Resp 18 | Ht 69.0 in | Wt 241.6 lb

## 2024-10-01 DIAGNOSIS — I10 Essential (primary) hypertension: Secondary | ICD-10-CM | POA: Diagnosis not present

## 2024-10-01 DIAGNOSIS — Z191 Hormone sensitive malignancy status: Secondary | ICD-10-CM | POA: Diagnosis not present

## 2024-10-01 DIAGNOSIS — Z87891 Personal history of nicotine dependence: Secondary | ICD-10-CM | POA: Diagnosis not present

## 2024-10-01 DIAGNOSIS — C61 Malignant neoplasm of prostate: Secondary | ICD-10-CM

## 2024-10-01 DIAGNOSIS — E782 Mixed hyperlipidemia: Secondary | ICD-10-CM | POA: Diagnosis not present

## 2024-10-01 HISTORY — DX: Malignant neoplasm of prostate: C61

## 2024-10-01 HISTORY — DX: Elevated prostate specific antigen (PSA): R97.20

## 2024-10-01 NOTE — Progress Notes (Signed)
 Radiation Oncology         [phone] ________________________________  Multidisciplinary Prostate Cancer Clinic  Initial Radiation Oncology Consultation  Name: Leonard Burke MRN: [id number]  Date: 10/01/2024  DOB: 05-14-1951  RR:Fzbzmd, Garnette, MD  Carolee Sherwood JONETTA DOUGLAS, MD   REFERRING PHYSICIAN: Carolee Sherwood JONETTA DOUGLAS, MD  DIAGNOSIS: 73 y.o. gentleman with stage T1c adenocarcinoma of the prostate with a Gleason's score of 4+4 and a PSA of 9.2    ICD-10-CM   1. Malignant neoplasm of prostate (HCC)  C61       HISTORY OF PRESENT ILLNESS::Leonard Burke is a 73 y.o. gentleman. He has been followed by urology for an elevated PSA since at least 2019. He was initially diagnosed with Gleason 3+3 prostate cancer by Dr. Carolee in 12/2019 and elected to proceed with active surveillance. He was found to have upgraded disease to Gleason 3+4 on MRI fusion biopsy in 04/2021 with a PSA of 4.92. We met the patient in consultation on 06/23/21. At that time, he opted to proceed with continued active surveillance.  Since we met him, he has continued in close follow-up with Dr. Carolee. His PSA has slowly risen, reaching 7.3 in 01/2022. A surveillance MRI fusion biopsy on 11/28/22, showed low-volume Gleason 3+3 disease involving 2 of 20 cores, with both ROI negative for malignancy and two standard cores with only 5% involvement of Gleason 3+3 disease.  PSA at that time was 6.88.  His PSA increased to 9.2 on repeat in 06/2024. This prompted a surveillance prostate MRI on 08/14/24 showing a PI-RADS 5 lesion of the left posteromedial peripheral zone and PI-RADS 4 lesions in the right anterior transition zone and right anterior peripheral zone at the apex. The patient proceeded to repeat MRI fusion biopsy of the prostate on 08/29/24.  The prostate volume measured 66 cc.  Out of 15 core biopsies, 6 were positive.  The maximum Gleason score was 4+4, and this was seen in the left MRI ROI. Additionally, Gleason 4+3 was seen  in the left base, and Gleason 3+3 in the right apex, left apex, and both cores from the right-sided MRI ROI.  He underwent a staging PSMA PET scan on 09/30/24 that showed bilateral intraprostatic nodules that correlate well with his MRI and pathology findings but no evidence of disease outside of the prostate.       The patient reviewed the biopsy and imaging results with his urologist and he has kindly been referred today to the multidisciplinary prostate cancer clinic for presentation of pathology and radiology studies in our conference for discussion of potential radiation treatment options and clinical evaluation.  PREVIOUS RADIATION THERAPY: No  PAST MEDICAL HISTORY:  has a past medical history of Arthritis, Asthma, HTN (hypertension), Hypercholesteremia, and Hypertriglyceridemia.    PAST SURGICAL HISTORY: Past Surgical History:  Procedure Laterality Date   COLONOSCOPY     CYST EXCISION     KNEE ARTHROSCOPY     SHOULDER ARTHROSCOPY Bilateral    TOTAL KNEE ARTHROPLASTY Left 12/09/2021   Procedure: TOTAL KNEE ARTHROPLASTY;  Surgeon: Ernie Cough, MD;  Location: [facility];  Service: Orthopedics;  Laterality: Left;   TOTAL KNEE ARTHROPLASTY Right 02/03/2022   Procedure: TOTAL KNEE ARTHROPLASTY;  Surgeon: Ernie Cough, MD;  Location: [facility];  Service: Orthopedics;  Laterality: Right;    FAMILY HISTORY: family history is not on file.  SOCIAL HISTORY:  reports that he has quit smoking. His smoking use included cigarettes. He has never used smokeless tobacco. He reports  that he does not currently use alcohol. He reports that he does not use drugs.  ALLERGIES: Codeine, Penicillins, Ivp dye [iodinated contrast media], and Multihance  [gadobenate]  MEDICATIONS:  Current Outpatient Medications  Medication Sig Dispense Refill   acetaminophen  (TYLENOL ) 325 MG tablet Take 2 tablets (650 mg total) by mouth every 6 (six) hours as needed for mild pain (pain score 1-3), fever or headache. 20 tablet  0   amLODipine  (NORVASC ) 10 MG tablet Take 10 mg by mouth daily.     aspirin  EC 81 MG tablet Take 1 tablet (81 mg total) by mouth daily. Swallow whole. 30 tablet 0   atenolol  (TENORMIN ) 50 MG tablet Take 1 tablet (50 mg total) by mouth daily. 30 tablet 0   diphenhydramine -acetaminophen  (TYLENOL  PM) 25-500 MG TABS tablet Take 1 tablet by mouth at bedtime as needed (Sleep related symptoms).     fenofibrate  (TRICOR ) 145 MG tablet Take 1 tablet (145 mg total) by mouth daily. 90 tablet 3   Fluticasone-Salmeterol 113-14 MCG/ACT AEPB Inhale 1 puff into the lungs 2 (two) times daily.     Naphazoline-Polyethyl Glycol (EYE DROPS) 0.012-0.2 % SOLN Place 1 drop into both eyes daily as needed (Allergies/dry eyes).     polyethylene glycol (MIRALAX  / GLYCOLAX ) 17 g packet Take 17 g by mouth daily as needed for mild constipation. 14 each 0   rosuvastatin  (CRESTOR ) 5 MG tablet Take 5 mg by mouth daily. (Patient not taking: Reported on 09/11/2024)     No current facility-administered medications for this encounter.    REVIEW OF SYSTEMS:  On review of systems, the patient reports that he is doing well overall. He denies any chest pain, shortness of breath, cough, fevers, chills, night sweats, unintended weight changes. He denies any bowel disturbances, and denies abdominal pain, nausea or vomiting. He denies any new musculoskeletal or joint aches or pains. His IPSS was 12, indicating moderate urinary symptoms. His SHIM was 13, indicating he has moderate erectile dysfunction. A complete review of systems is obtained and is otherwise negative.   PHYSICAL EXAM:  Wt Readings from Last 3 Encounters:  10/01/24 241 lb 9.6 oz (109.6 kg)  09/11/24 240 lb (108.9 kg)  12/13/23 239 lb 9.6 oz (108.7 kg)   Temp Readings from Last 3 Encounters:  10/01/24 97.9 F (36.6 C)  09/11/24 98.5 F (36.9 C)  12/03/23 98.5 F (36.9 C) (Oral)   BP Readings from Last 3 Encounters:  10/01/24 (!) 152/71  09/11/24 (!) 142/76   12/13/23 120/70   Pulse Readings from Last 3 Encounters:  10/01/24 73  09/11/24 65  12/13/23 61    /10  In general this is a well appearing Caucasian man in no acute distress. He's alert and oriented x4 and appropriate throughout the examination. Cardiopulmonary assessment is negative for acute distress and he exhibits normal effort.    KPS = 100  100 - Normal; no complaints; no evidence of disease. 90   - Able to carry on normal activity; minor signs or symptoms of disease. 80   - Normal activity with effort; some signs or symptoms of disease. 34   - Cares for self; unable to carry on normal activity or to do active work. 60   - Requires occasional assistance, but is able to care for most of his personal needs. 50   - Requires considerable assistance and frequent medical care. 40   - Disabled; requires special care and assistance. 30   - Severely disabled; hospital admission is indicated  although death not imminent. 20   - Very sick; hospital admission necessary; active supportive treatment necessary. 10   - Moribund; fatal processes progressing rapidly. 0     - Dead  Karnofsky DA, Abelmann WH, Craver LS and Burchenal JH [phone]) The use of the nitrogen mustards in the palliative treatment of carcinoma: with particular reference to bronchogenic carcinoma Cancer 1 [phone]   LABORATORY DATA:  Lab Results  Component Value Date   WBC 8.9 12/03/2023   HGB 12.2 (L) 12/03/2023   HCT 35.1 (L) 12/03/2023   MCV 90.5 12/03/2023   PLT 153 12/03/2023   Lab Results  Component Value Date   NA 138 12/03/2023   K 4.2 12/03/2023   CL 104 12/03/2023   CO2 24 12/03/2023   Lab Results  Component Value Date   ALT 19 01/25/2022   AST 21 01/25/2022   ALKPHOS 60 01/25/2022   BILITOT 0.4 01/25/2022     RADIOGRAPHY: NM PET (PSMA) SKULL TO MID THIGH Result Date: 10/01/2024 EXAM: PROSTATE PET SKULL BASE TO MID THIGHS 09/30/2024 05:47:49 PM TECHNIQUE: RADIOPHARMACEUTICAL: 8.7 mCi  (Posluma )  injected intravenously. PET imaging was obtained from skull vertex to mid thighs. Computed tomography was used for attenuation correction and localization. Fusion imaging was obtained. COMPARISON: Prostate MRI of 08/14/2024 CTA chest of 12/01/2023 also reviewed. CLINICAL HISTORY: Prostate cancer.staging. FINDINGS: PROSTATE AND PROSTATE BED: Foci of trace affinity within the prostate. The most significant are in the right mid gland to apex more anteriorly at SUV 14.9 and in the left posterior paramidline mid gland/apex at SUV 7.9. Moderate prostatomegaly. LYMPH NODES: No tracer avid cervical nodes. BONES: No abnormal PSMA activity localizes to the bones. OTHER PET FINDINGS: Physiologic activity within the salivary glands, liver, spleen, kidneys, bowel, and urinary bladder. Minimal fluid in the left maxillary sinus. Bilateral carotid atherosclerosis. Thoracic aortic and coronary artery atherosclerosis. Abdominal aortic atherosclerosis. Right greater than left base scarring. Moderate hepatic steatosis with nonspecific caudate lobe enlargement. Dependent gallstones. Lower pole right renal 2.8 cm low density lesion is likely a cyst and does not warrant specific imaging follow up. Normal adrenal glands. Mild cardiomegaly. Pulmonary artery enlargement, outflow tract 3.4 cm. Beam hardening artifact from bilateral shoulder arthroplasties, limiting evaluation of the lower neck and upper chest. Moderate fat containing right inguinal hernia. IMPRESSION: 1. Tracer-avid prostatic primaries. No evidence of tracer-avid metastatic disease. 2. Incidental findings, including: Aortic atherosclerosis (icd10-i70.0). Coronary artery atherosclerosis. Pulmonary artery enlargement suggests pulmonary arterial hypertension. hepatic steatosis. cholelithiasis. Electronically signed by: Rockey Kilts MD 10/01/2024 08:32 AM EST RP Workstation: [id number]      IMPRESSION/PLAN: 73 y.o. gentleman with Stage T1c adenocarcinoma of the prostate with a  Gleason score of 4+4 and a PSA of 9.2.    We discussed the patient's workup and outlined the nature of prostate cancer in this setting. The patient's T stage, Gleason's score, and PSA put him into the high risk group. Accordingly, he is eligible for a variety of potential treatment options including prostatectomy or LT-ADT concurrent with either 8 weeks of external radiation or 5 weeks of external radiation with an upfront brachytherapy boost. We discussed the available radiation techniques, and focused on the details and logistics of delivery. The patient may not be an ideal candidate for brachytherapy boost with a prostate volume of 66 g prior to downsizing from hormone therapy.  However, with the addition of hormone therapy, we feel confident that this will downsize the prostate to a size that we can safely implant.  We  discussed and outlined the risks, benefits, short and long-term effects associated with radiotherapy and compared and contrasted these with prostatectomy. We discussed the role of SpaceOAR gel in reducing the rectal toxicity associated with radiotherapy. We also detailed the role of ADT in the treatment of high risk prostate cancer and outlined the associated side effects that could be expected with this therapy. He appears to have a good understanding of his disease and our treatment recommendations which are of curative intent.  He was encouraged to ask questions that were answered to his stated satisfaction.  At the end of the conversation the patient is undecided regarding his treatment preference.  He will also meet with Dr. Renda and Dr. Tina to further discuss treatment options as part of our multidisciplinary clinic this afternoon.  He has our contact information and will let us  know if he ultimately elects to proceed with one of the radiotherapy options as we would need to start ADT now, in anticipation of beginning his radiotherapy approximately 2 months thereafter.  We enjoyed  meeting him today and look forward to continuing to participate in his care.  He knows that he is welcome to call at anytime with any questions or concerns related to the treatment options discussed today.   We personally spent 60 minutes in this encounter including chart review, reviewing radiological studies, meeting face-to-face with the patient, entering orders and completing documentation.    Sabra MICAEL Rusk, PA-C    Donnice Barge, MD  [facility]  Radiation Oncology Direct Dial: [phone]  Fax: [phone] [email or web address]  Skype  [email or web address]   This document serves as a record of services personally performed by Donnice Barge, MD and Sabra Rusk, PA-C. It was created on their behalf by Izetta Neither, a trained medical scribe. The creation of this record is based on the scribe's personal observations and the provider's statements to them. This document has been checked and approved by the attending provider.

## 2024-10-01 NOTE — Progress Notes (Incomplete)
 SABRA

## 2024-10-01 NOTE — Assessment & Plan Note (Addendum)
 Patient will follow-up with radiation oncology or urologic oncology for definitive treatment.   calcium  (1000-1200 mg daily from food and supplements) and vitamin D3 (1000 IU daily) Healthy lifestyle to prevent diabetes and CV disease Daily exercises (30 minutes per day) Limit alcohol consumption and avoid smoking

## 2024-10-07 NOTE — Therapy (Incomplete)
 OUTPATIENT PHYSICAL THERAPY THORACOLUMBAR EVALUATION   Patient Name: Leonard Burke MRN: [id number] DOB:February 12, 1951, 73 y.o., male Today's Date: 10/07/2024  END OF SESSION:   Past Medical History:  Diagnosis Date   Arthritis    Asthma    Elevated PSA    HTN (hypertension)    Hypercholesteremia    Hypertriglyceridemia    Prostate cancer (HCC)    Past Surgical History:  Procedure Laterality Date   COLONOSCOPY     CYST EXCISION     KNEE ARTHROSCOPY     PROSTATE BIOPSY     SHOULDER ARTHROSCOPY Bilateral    TOTAL KNEE ARTHROPLASTY Left 12/09/2021   Procedure: TOTAL KNEE ARTHROPLASTY;  Surgeon: Ernie Cough, MD;  Location: [facility];  Service: Orthopedics;  Laterality: Left;   TOTAL KNEE ARTHROPLASTY Right 02/03/2022   Procedure: TOTAL KNEE ARTHROPLASTY;  Surgeon: Ernie Cough, MD;  Location: [facility];  Service: Orthopedics;  Laterality: Right;   Patient Active Problem List   Diagnosis Date Noted   Acute hypoxic respiratory failure (HCC) 12/01/2023   S/P total knee arthroplasty, right 02/03/2022   S/P total knee arthroplasty, left 12/09/2021   Malignant neoplasm of prostate (HCC) 06/23/2021   HTN (hypertension)    Hypertriglyceridemia     PCP: ***  REFERRING PROVIDER: Donaciano Sprang MD  REFERRING DIAG: M54.50 (ICD-10-CM) - Low back pain, unspecified   Rationale for Evaluation and Treatment: Rehabilitation  THERAPY DIAG:  No diagnosis found.  ONSET DATE: ***  SUBJECTIVE:                                                                                                                                                                                           SUBJECTIVE STATEMENT: ***  PERTINENT HISTORY:  ***  PAIN:  Are you having pain? Yes: NPRS scale: *** Pain location: *** Pain description: *** Aggravating factors: *** Relieving factors: ***  PRECAUTIONS: {Therapy precautions:24002}  RED FLAGS: {PT Red Flags:29287}   WEIGHT BEARING RESTRICTIONS:  No  FALLS:  Has patient fallen in last 6 months? {fallsyesno:27318}  LIVING ENVIRONMENT: Lives with: {[facility] lives with:25569::lives with their family} Lives in: {Lives in:25570} Stairs: {opstairs:27293} Has following equipment at home: {Assistive devices:23999}  OCCUPATION: ***  PLOF: {PLOF:24004}  PATIENT GOALS: ***  NEXT MD VISIT: ***  OBJECTIVE:  Note: Objective measures were completed at Evaluation unless otherwise noted.  DIAGNOSTIC FINDINGS:  ***  PATIENT SURVEYS:  ODI  COGNITION: Overall cognitive status: Within functional limits for tasks assessed     SENSATION: {sensation:27233}  MUSCLE LENGTH: Hamstrings: Right *** deg; Left *** deg  POSTURE: {posture:25561}  PALPATION: ***  LUMBAR ROM:   AROM eval  Flexion   Extension   Right lateral flexion   Left lateral flexion   Right rotation   Left rotation    (Blank rows = not tested)  LOWER EXTREMITY ROM:     {AROM/PROM:27142}  Right eval Left eval  Hip flexion    Hip extension    Hip abduction    Hip adduction    Hip internal rotation    Hip external rotation    Knee flexion    Knee extension    Ankle dorsiflexion    Ankle plantarflexion    Ankle inversion    Ankle eversion     (Blank rows = not tested)  LOWER EXTREMITY MMT:    MMT Right eval Left eval  Hip flexion    Hip extension    Hip abduction    Hip adduction    Hip internal rotation    Hip external rotation    Knee flexion    Knee extension    Ankle dorsiflexion    Ankle plantarflexion    Ankle inversion    Ankle eversion     (Blank rows = not tested)  LUMBAR SPECIAL TESTS:  {lumbar special test:25242}  FUNCTIONAL TESTS:  5 times sit to stand: *** Timed up and go (TUG): ***  GAIT: Distance walked: *** Assistive device utilized: {Assistive devices:23999} Level of assistance: {Levels of assistance:24026} Comments: ***  TREATMENT  Eval Self care:Posture and optometrist instruction                                                                                                                              PATIENT EDUCATION:  Education details: Discussed eval findings, rehab rationale, aquatic program progression/POC and pools in area. Patient is in agreement  Person educated: Patient Education method: Explanation Education comprehension: verbalized understanding  HOME EXERCISE PROGRAM: Aquatic TBA  ASSESSMENT:  CLINICAL IMPRESSION: Patient is a *** y.o. *** who was seen today for physical therapy evaluation and treatment for LBP.   OBJECTIVE IMPAIRMENTS: {opptimpairments:25111}.   ACTIVITY LIMITATIONS: {activitylimitations:27494}  PARTICIPATION LIMITATIONS: {participationrestrictions:25113}  PERSONAL FACTORS: {Personal factors:25162} are also affecting patient's functional outcome.   REHAB POTENTIAL: {rehabpotential:25112}  CLINICAL DECISION MAKING: {clinical decision making:25114}  EVALUATION COMPLEXITY: {Evaluation complexity:25115}   GOALS: Goals reviewed with patient? Yes  SHORT TERM GOALS: Target date: ***  Pt will tolerate full aquatic sessions consistently without increase in pain and with improving function to demonstrate good toleration and effectiveness of intervention.  Baseline: Goal status: INITIAL  2.  *** Baseline:  Goal status: INITIAL  3.  *** Baseline:  Goal status: INITIAL  4.  *** Baseline:  Goal status: INITIAL  5.  *** Baseline:  Goal status: INITIAL  6.  *** Baseline:  Goal status: INITIAL  LONG TERM GOALS: Target date: ***  Pt to improve on ODI by   % to demonstrate statistically significant Improvement in function. (MCID 13-15%) Baseline:  Goal status: INITIAL  2.  Pt will report  decrease in pain by at least 50% for improved toleration to activity/quality of life and to demonstrate improved management of pain. Baseline:  Goal status: INITIAL  3.  *** Baseline:  Goal status: INITIAL  4.  *** Baseline:  Goal status:  INITIAL  5.  *** Baseline:  Goal status: INITIAL  6.  *** Baseline:  Goal status: INITIAL  PLAN:  PT FREQUENCY: {rehab frequency:25116}  PT DURATION: {rehab duration:25117}  PLANNED INTERVENTIONS: 97164- PT Re-evaluation, 97750- Physical Performance Testing, 97110-Therapeutic exercises, 97530- Therapeutic activity, 97112- Neuromuscular re-education, 97535- Self Care, 02859- Manual therapy, [id number]- Gait training, [id number]- Aquatic Therapy, [phone]- Ionotophoresis 4mg /ml Dexamethasone , 79439 (1-2 muscles), 20561 (3+ muscles)- Dry Needling, Patient/Family education, Balance training, Stair training, Taping, Joint mobilization, DME instructions, Cryotherapy, and Moist heat.  PLAN FOR NEXT SESSION: ***   Frankie Filmore [place], PT 10/07/2024, 8:28 AM

## 2024-10-09 ENCOUNTER — Ambulatory Visit (HOSPITAL_BASED_OUTPATIENT_CLINIC_OR_DEPARTMENT_OTHER): Admitting: Physical Therapy

## 2024-10-11 ENCOUNTER — Telehealth: Payer: Self-pay | Admitting: *Deleted

## 2024-10-11 NOTE — Telephone Encounter (Signed)
 Called patient to inform of ADT appt. for 10-22-24- arrival time- 11 am, spoke with patient and he is aware of this appt.

## 2024-10-11 NOTE — Progress Notes (Addendum)
 RN spoke with patient to follow up after recent [facility] visit for his high risk prostate cancer.   Patient has confirmed he would like to proceed with LT-ADT and 8 weeks of IMRT.  Pending ADT auth/date at this time.   Patient does wish to delay radiation until after he is cleared by his cardiologist for a colonoscopy.   Patient will see his cardiologist on 12/31.  RN provided reassurance that we can ensure radiation starts after colonoscopy since he will have ADT.

## 2024-10-16 ENCOUNTER — Other Ambulatory Visit: Payer: Self-pay

## 2024-10-16 ENCOUNTER — Ambulatory Visit (HOSPITAL_BASED_OUTPATIENT_CLINIC_OR_DEPARTMENT_OTHER): Payer: Self-pay | Admitting: Physical Therapy

## 2024-10-16 ENCOUNTER — Encounter (HOSPITAL_BASED_OUTPATIENT_CLINIC_OR_DEPARTMENT_OTHER): Payer: Self-pay | Admitting: Physical Therapy

## 2024-10-16 DIAGNOSIS — R293 Abnormal posture: Secondary | ICD-10-CM

## 2024-10-16 DIAGNOSIS — M6281 Muscle weakness (generalized): Secondary | ICD-10-CM

## 2024-10-16 DIAGNOSIS — M5459 Other low back pain: Secondary | ICD-10-CM

## 2024-10-16 NOTE — Therapy (Unsigned)
 OUTPATIENT PHYSICAL THERAPY THORACOLUMBAR EVALUATION   Patient Name: Leonard Burke MRN: [id number] DOB:1951-09-29, 73 y.o., male Today's Date: 10/16/2024  END OF SESSION:  PT End of Session - 10/16/24 1124     Visit Number 1    Date for Recertification  12/13/24    Authorization Type [employer] mcr    PT Start Time [phone]    PT Stop Time 0930    PT Time Calculation (min) 40 min    Activity Tolerance Patient tolerated treatment well    Behavior During Therapy WFL for tasks assessed/performed          Past Medical History:  Diagnosis Date   Arthritis    Asthma    Elevated PSA    HTN (hypertension)    Hypercholesteremia    Hypertriglyceridemia    Prostate cancer (HCC)    Past Surgical History:  Procedure Laterality Date   COLONOSCOPY     CYST EXCISION     KNEE ARTHROSCOPY     PROSTATE BIOPSY     SHOULDER ARTHROSCOPY Bilateral    TOTAL KNEE ARTHROPLASTY Left 12/09/2021   Procedure: TOTAL KNEE ARTHROPLASTY;  Surgeon: Ernie Cough, MD;  Location: [facility];  Service: Orthopedics;  Laterality: Left;   TOTAL KNEE ARTHROPLASTY Right 02/03/2022   Procedure: TOTAL KNEE ARTHROPLASTY;  Surgeon: Ernie Cough, MD;  Location: [facility];  Service: Orthopedics;  Laterality: Right;   Patient Active Problem List   Diagnosis Date Noted   Acute hypoxic respiratory failure (HCC) 12/01/2023   S/P total knee arthroplasty, right 02/03/2022   S/P total knee arthroplasty, left 12/09/2021   Malignant neoplasm of prostate (HCC) 06/23/2021   HTN (hypertension)    Hypertriglyceridemia     PCP: Elspeth Gleason MD  REFERRING PROVIDER: Donaciano Sprang MD  REFERRING DIAG: M54.50 (ICD-10-CM) - Low back pain, unspecified   Rationale for Evaluation and Treatment: Rehabilitation  THERAPY DIAG:  Other low back pain  Muscle weakness (generalized)  Abnormal posture  ONSET DATE: 2 yrs  SUBJECTIVE:                                                                                                                                                                                            SUBJECTIVE STATEMENT: I have a lot going on need to have prostrate ca treatment. Will need to stop therapy in feb. Right now I am doing nothing exercise wise.  My weight shot up.  Had shots in back with some relief but not much.  Have had both knees and shoulder replacements in past 2 years. I have foot drop right maybe for years didn't realize it. Waiting for  a brace.  Have been tripping over it for years thought I was just clumsy.  PERTINENT HISTORY:  As per Dr Burnetta: . SABRA Want to try conservative treatment with aquatic therapy . . .as surgical intervention which more than likely would be a two-level spinal fusion   Bilateral TKR and TSR  PAIN:  Are you having pain? Yes: NPRS scale: current 4/10 Pain location: LB Pain description: ache Aggravating factors: 1st thing in am; bending, twisting Relieving factors: movement  PRECAUTIONS: None   WEIGHT BEARING RESTRICTIONS: No  FALLS:  Has patient fallen in last 6 months? Yes. Number of falls 4  tripping up steps  OCCUPATION: retired  PLOF: Independent  PATIENT GOALS: reduce LBP and increase strength  NEXT MD VISIT: as needed  OBJECTIVE:  Note: Objective measures were completed at Evaluation unless otherwise noted.  DIAGNOSTIC FINDINGS:  None in chart  PATIENT SURVEYS:  ODI: 17/50=34%  COGNITION: Overall cognitive status: Within functional limits for tasks assessed      MUSCLE LENGTH: Hamstrings: tested in sitting: right hamstring tight>Left   POSTURE: rounded shoulders, forward head, right pelvic obliquity, and flexed trunk   PALPATION: Mild TTP aong belt line equally L/R  LUMBAR ROM:   AROM eval  Flexion FT to patella  Extension 0  Right lateral flexion Full   Left lateral flexion full  Right rotation   Left rotation    (Blank rows = not tested)  LOWER EXTREMITY ROM:     Active  Right eval Left eval  Hip flexion     Hip extension    Hip abduction    Hip adduction tight tight  Hip internal rotation 20d wfl  Hip external rotation    Knee flexion    Knee extension -8 -2  Ankle dorsiflexion    Ankle plantarflexion    Ankle inversion    Ankle eversion     (Blank rows = not tested)  LOWER EXTREMITY MMT:    MMT Right eval Left eval  Hip flexion 30.0 34.7  Hip extension    Hip abduction 17.5 12.7  Hip adduction    Hip internal rotation    Hip external rotation    Knee flexion    Knee extension    Ankle dorsiflexion 1 4  Ankle plantarflexion    Ankle inversion    Ankle eversion     (Blank rows = not tested)  LUMBAR SPECIAL TESTS:  Slump test: Negative  FUNCTIONAL TESTS:  5 times sit to stand: from pool bench: 19.48 Timed up and go (TUG): 18.61    Item Test date: 10/16/24 Date:  Date:   Sitting to standing 3. able to stand independently using hands Insert SmartPhrase OPRCBERGREEVAL Insert SmartPhrase OPRCBERGREEVAL  2. Standing unsupported 4. able to stand safely for 2 minutes    3. Sitting with back unsupported, feet supported 4. able to sit safely and securely for 2 minutes    4. Standing to sitting 3. controls descent by using hands    5. Pivot transfer  3. able to transfer safely with definite need of hands    6. Standing unsupported with eyes closed 1. unable to keep eyes closed 3 seconds but stays standing safely    7. Standing unsupported with feet together 0. needs help to attain position and unable to hold for 15 seconds    8. Reaching forward with outstretched arms while standing 2. can reach forward 5 cm (2 inches)    9. Pick up object from the floor  from standing 2. unable to pick up but reaches 2-5 cm (1-2 inches) from slipper and keeps balance independently    10. Turning to look behind over left and right shoulders while standing 2. turns sideways but only maintains balance    11. Turn 360 degrees 2. able to turn 360 degrees safely but slowly    12. Place alternate foot  on step or stool while standing unsupported 0. needs assistance to keep from falling/unable to try    13. Standing unsupported one foot in front 0. loses balance while stepping or standing    14. Standing on one leg 0. unable to try of needs assist to prevent fall      Total Score 26/56 Total Score:    Total Score:     GAIT: Distance walked: 500 ft Assistive device utilized: None Level of assistance: Complete Independence Comments: cadence slowed, shortened step length, right leaning, reduced hip flex through swing  TREATMENT  Eval Self care:Posture and body mechanic instruction and handout; STS transfers; sitting posture.                                                                                                                                PATIENT EDUCATION:  Education details: Discussed eval findings, rehab rationale, aquatic program progression/POC and pools in area. Patient is in agreement Person educated: Patient Education method: Chief Technology Officer Education comprehension: verbalized understanding  HOME EXERCISE PROGRAM: Aquatic TBA  ASSESSMENT:  CLINICAL IMPRESSION: Patient is a 73 y.o. m who was seen today for physical therapy evaluation and treatment for LBP.He presents to aquatic setting today amb without AD and reports of pending prostate ca treatments, uncertain of his ability to participate in PT.  Patient presents with pain limited deficits in LB which limit  lumbar ROM particularly in extension, endurance, activity tolerance, gait, balance, strength and functional mobility with ADL's.  There  are no diagnostics in chart but as per note by Dr Burnetta he may require a 2 level spinal fusion if aquatic intervention not successful as he has had failed land based PT treatment causing increase in pain. Patient is having to modify and restrict ADL's as indicated by outcome measure score, subjective information and objective measures which is affecting overall  participation. He will benefit from skilled aquatic PT to improve all areas of deficits.     OBJECTIVE IMPAIRMENTS: Abnormal gait, decreased activity tolerance, decreased balance, decreased endurance, decreased mobility, difficulty walking, decreased ROM, decreased strength, decreased safety awareness, postural dysfunction, and pain.   ACTIVITY LIMITATIONS: carrying, lifting, bending, sitting, standing, squatting, stairs, transfers, and locomotion level  PARTICIPATION LIMITATIONS: meal prep, cleaning, shopping, community activity, and yard work  PERSONAL FACTORS: Age, Past/current experiences, Time since onset of injury/illness/exacerbation, and 1-2 comorbidities: see pmHx are also affecting patient's functional outcome.   REHAB POTENTIAL: Fair  CLINICAL DECISION MAKING: Evolving/moderate complexity  EVALUATION COMPLEXITY: Moderate   GOALS: Goals reviewed with patient? Yes  SHORT TERM GOALS: Target date: 11/16/24  Pt will tolerate full aquatic sessions consistently without increase in pain and with improving function to demonstrate good toleration and effectiveness of intervention.  Baseline: Goal status: INITIAL  2.  Pt will complete 10 consecutive STS from water  bench onto water  stepp without ue support Baseline:  Goal status: INITIAL  3.  Pt will use alternating patterns negotiating 7 steps into and out of pool ue support hand rail as needed. Baseline:  Goal status: INITIAL    LONG TERM GOALS: Target date: 12/13/24  Pt to improve on ODI by 13% to demonstrate statistically significant Improvement in function. (MCID 13-15%) Baseline: 17/50=34% Goal status: INITIAL  2.  Pt will improve on Berg balance test to >/= 36/56 to demonstrate a decrease in fall risk. (MDC 7) Baseline: 26/56 medium fall risk Goal status: INITIAL  3.  Pt will improve strength in hips by 19 lbs to demonstrate improved overall physical function Baseline:  Goal status: INITIAL  4.  Pt will report  decrease in pain by at least 50% for improved toleration to activity/quality of life and to demonstrate improved management of pain. Baseline:  Goal status: INITIAL  5.  Pt will consider gaining pool access for use of the properties of water  for chronic conditions maintaining mobility and minimizing pain. Baseline:  Goal status: INITIAL    PLAN:  PT FREQUENCY: 1-2x/week  PT DURATION: 8 weeks extended 2 weeks due to scheduling conflicts through holidays  PLANNED INTERVENTIONS: 97164- PT Re-evaluation, 97750- Physical Performance Testing, 97110-Therapeutic exercises, 97530- Therapeutic activity, 97112- Neuromuscular re-education, 97535- Self Care, 02859- Manual therapy, [phone]- Gait training, [phone]- Aquatic Therapy, [phone]- Electrical stimulation (unattended), [phone]- Electrical stimulation (manual), [id number]- Ionotophoresis 4mg /ml Dexamethasone , 79439 (1-2 muscles), 20561 (3+ muscles)- Dry Needling, Patient/Family education, Balance training, Stair training, Taping, Joint mobilization, DME instructions, Cryotherapy, and Moist heat.  PLAN FOR NEXT SESSION: aquatic only: le/core strengthening and stretching increased focus R ankle; balance retraining   Ronal Foots) Aloni Chuang MPT 10/20/2024 6:22 PM [facility] [address] [place], [place], [address] Phone: [phone]   Fax:  [phone]

## 2024-10-22 DIAGNOSIS — C61 Malignant neoplasm of prostate: Secondary | ICD-10-CM | POA: Diagnosis not present

## 2024-10-22 NOTE — Progress Notes (Signed)
 Patient met with Dr. Carolee today at [facility] and will be started on Orgovyx.

## 2024-10-23 ENCOUNTER — Ambulatory Visit (HOSPITAL_BASED_OUTPATIENT_CLINIC_OR_DEPARTMENT_OTHER): Payer: Self-pay | Admitting: Physical Therapy

## 2024-10-23 DIAGNOSIS — R293 Abnormal posture: Secondary | ICD-10-CM

## 2024-10-23 DIAGNOSIS — M5459 Other low back pain: Secondary | ICD-10-CM | POA: Diagnosis not present

## 2024-10-23 DIAGNOSIS — M6281 Muscle weakness (generalized): Secondary | ICD-10-CM

## 2024-10-23 NOTE — Therapy (Signed)
 OUTPATIENT PHYSICAL THERAPY THORACOLUMBAR TREATMENT   Patient Name: Leonard Burke MRN: [id number] DOB:December 13, 1950, 73 y.o., male Today's Date: 10/23/2024  END OF SESSION:  PT End of Session - 10/23/24 0929     Visit Number 2    Date for Recertification  12/13/24    Authorization Type [employer] mcr    PT Start Time 0845    PT Stop Time 0924    PT Time Calculation (min) 39 min    Activity Tolerance Patient tolerated treatment well    Behavior During Therapy WFL for tasks assessed/performed           Past Medical History:  Diagnosis Date   Arthritis    Asthma    Elevated PSA    HTN (hypertension)    Hypercholesteremia    Hypertriglyceridemia    Prostate cancer (HCC)    Past Surgical History:  Procedure Laterality Date   COLONOSCOPY     CYST EXCISION     KNEE ARTHROSCOPY     PROSTATE BIOPSY     SHOULDER ARTHROSCOPY Bilateral    TOTAL KNEE ARTHROPLASTY Left 12/09/2021   Procedure: TOTAL KNEE ARTHROPLASTY;  Surgeon: Ernie Cough, MD;  Location: [facility];  Service: Orthopedics;  Laterality: Left;   TOTAL KNEE ARTHROPLASTY Right 02/03/2022   Procedure: TOTAL KNEE ARTHROPLASTY;  Surgeon: Ernie Cough, MD;  Location: [facility];  Service: Orthopedics;  Laterality: Right;   Patient Active Problem List   Diagnosis Date Noted   Acute hypoxic respiratory failure (HCC) 12/01/2023   S/P total knee arthroplasty, right 02/03/2022   S/P total knee arthroplasty, left 12/09/2021   Malignant neoplasm of prostate (HCC) 06/23/2021   HTN (hypertension)    Hypertriglyceridemia     PCP: Elspeth Gleason MD  REFERRING PROVIDER: Donaciano Sprang MD  REFERRING DIAG: M54.50 (ICD-10-CM) - Low back pain, unspecified   Rationale for Evaluation and Treatment: Rehabilitation  THERAPY DIAG:  Other low back pain  Muscle weakness (generalized)  Abnormal posture  ONSET DATE: 2 yrs  SUBJECTIVE:                                                                                                                                                                                            SUBJECTIVE STATEMENT: Pt reports he knows how to swim, but hasn't been in water  in about 4 years.  Is scheduled to start radiation in February, but dates are not set yet.   POOL ACCESS:  Initial evaluation: I have a lot going on need to have prostrate ca treatment. Will need to stop therapy in feb. Right now I am doing nothing exercise wise.  My weight  shot up.  Had shots in back with some relief but not much.  Have had both knees and shoulder replacements in past 2 years. I have foot drop right maybe for years didn't realize it. Waiting for a brace.  Have been tripping over it for years thought I was just clumsy.  PERTINENT HISTORY:  As per Dr Burnetta: . SABRA Want to try conservative treatment with aquatic therapy . . .as surgical intervention which more than likely would be a two-level spinal fusion   Bilateral TKR and TSR  PAIN:  Are you having pain? Yes: NPRS scale: current 2/10 Pain location: LB, hips  Pain description: ache Aggravating factors: 1st thing in am; bending, twisting Relieving factors: movement  PRECAUTIONS: None   WEIGHT BEARING RESTRICTIONS: No  FALLS:  Has patient fallen in last 6 months? Yes. Number of falls 4  tripping up steps  OCCUPATION: retired  PLOF: Independent  PATIENT GOALS: reduce LBP and increase strength  NEXT MD VISIT: as needed  OBJECTIVE:  Note: Objective measures were completed at Evaluation unless otherwise noted.  DIAGNOSTIC FINDINGS:  None in chart  PATIENT SURVEYS:  ODI: 17/50=34%  COGNITION: Overall cognitive status: Within functional limits for tasks assessed      MUSCLE LENGTH: Hamstrings: tested in sitting: right hamstring tight>Left   POSTURE: rounded shoulders, forward head, right pelvic obliquity, and flexed trunk   PALPATION: Mild TTP aong belt line equally L/R  LUMBAR ROM:   AROM eval  Flexion FT to patella  Extension 0   Right lateral flexion Full   Left lateral flexion full  Right rotation   Left rotation    (Blank rows = not tested)  LOWER EXTREMITY ROM:     Active  Right eval Left eval  Hip flexion    Hip extension    Hip abduction    Hip adduction tight tight  Hip internal rotation 20d wfl  Hip external rotation    Knee flexion    Knee extension -8 -2  Ankle dorsiflexion    Ankle plantarflexion    Ankle inversion    Ankle eversion     (Blank rows = not tested)  LOWER EXTREMITY MMT:    MMT Right eval Left eval  Hip flexion 30.0 34.7  Hip extension    Hip abduction 17.5 12.7  Hip adduction    Hip internal rotation    Hip external rotation    Knee flexion    Knee extension    Ankle dorsiflexion 1 4  Ankle plantarflexion    Ankle inversion    Ankle eversion     (Blank rows = not tested)  LUMBAR SPECIAL TESTS:  Slump test: Negative  FUNCTIONAL TESTS:  5 times sit to stand: from pool bench: 19.48 Timed up and go (TUG): 18.61    Item Test date: 10/16/24 Date:  Date:   Sitting to standing 3. able to stand independently using hands Insert SmartPhrase OPRCBERGREEVAL Insert SmartPhrase OPRCBERGREEVAL  2. Standing unsupported 4. able to stand safely for 2 minutes    3. Sitting with back unsupported, feet supported 4. able to sit safely and securely for 2 minutes    4. Standing to sitting 3. controls descent by using hands    5. Pivot transfer  3. able to transfer safely with definite need of hands    6. Standing unsupported with eyes closed 1. unable to keep eyes closed 3 seconds but stays standing safely    7. Standing unsupported with feet together 0. needs  help to attain position and unable to hold for 15 seconds    8. Reaching forward with outstretched arms while standing 2. can reach forward 5 cm (2 inches)    9. Pick up object from the floor from standing 2. unable to pick up but reaches 2-5 cm (1-2 inches) from slipper and keeps balance independently    10. Turning to  look behind over left and right shoulders while standing 2. turns sideways but only maintains balance    11. Turn 360 degrees 2. able to turn 360 degrees safely but slowly    12. Place alternate foot on step or stool while standing unsupported 0. needs assistance to keep from falling/unable to try    13. Standing unsupported one foot in front 0. loses balance while stepping or standing    14. Standing on one leg 0. unable to try of needs assist to prevent fall      Total Score 26/56 Total Score:    Total Score:     GAIT: Distance walked: 500 ft Assistive device utilized: None Level of assistance: Complete Independence Comments: cadence slowed, shortened step length, right leaning, reduced hip flex through swing  TREATMENT  [facility] Adult PT Treatment:                                             Date: 10/23/24 Pt seen for aquatic therapy today.  Treatment took place in water  3.5-4.75 ft in depth at the [facility] pool. Temp of water  was 91.  Pt entered/exited the pool via  stairs with bil rail.  - Intro to aquatic therapy principles - supported with barbell walking forward/ backward in 4+ ft water -> without support, with reciprocal arm swing - unsupported side stepping -> with arm add/abdct with rainbow hand floats - suitcase carry with bil rainbow hand floats at side, marching forward/ backward - UE on wall:  toe/heel raises x10; hip add/abd x10 ; hip flexion /extension x10; - return to walking forward, unsupported  - relaxed squat at wall - TrA set with hollow-> solid pull down to thighs -> in staggered stance  (unsteady when RLE in back) - seated on bench in pool: cycling and hip abdct/ add  - standing hamstring stretch with heel on 2nd step, 15s x 2 each LE - bil gastroc stretch with heels off edge of step x 15s  Pt requires the buoyancy and hydrostatic pressure of water  for support, and to offload joints by unweighting joint load by at least 50 % in navel deep water  and by at  least 75-80% in chest to neck deep water .  Viscosity of the water  is needed for resistance of strengthening. Water  current perturbations provides challenge to standing balance requiring increased core activation.                                                                                                            PATIENT EDUCATION:  Education details: intro to aquatic therapy  Person educated: Patient Education method: Explanation Education comprehension: verbalized understanding  HOME EXERCISE PROGRAM: Aquatic TBA  ASSESSMENT:  CLINICAL IMPRESSION: Pt demonstrates safety and independence in aquatic setting with therapist instructing from deck. Pt is confident in setting, moving throughout all depths easily. Initially started with UE support on barbell, but acclimated quickly and had good balance without floatation support.  Pt is directed through various movement patterns and trials in both sitting and standing positions.  Pt is provided VC and demonstration throughout session for execution of exercises and while monitoring toleration.Pt reported minimal pain in back and hips during session.  Lt hamstring visually tighter than Rt.  Goals are ongoing.     Initial impression:  Patient is a 73 y.o. m who was seen today for physical therapy evaluation and treatment for LBP.He presents to aquatic setting today amb without AD and reports of pending prostate ca treatments, uncertain of his ability to participate in PT.  Patient presents with pain limited deficits in LB which limit  lumbar ROM particularly in extension, endurance, activity tolerance, gait, balance, strength and functional mobility with ADL's.  There  are no diagnostics in chart but as per note by Dr Burnetta he may require a 2 level spinal fusion if aquatic intervention not successful as he has had failed land based PT treatment causing increase in pain. Patient is having to modify and restrict ADL's as indicated by outcome  measure score, subjective information and objective measures which is affecting overall participation. He will benefit from skilled aquatic PT to improve all areas of deficits.     OBJECTIVE IMPAIRMENTS: Abnormal gait, decreased activity tolerance, decreased balance, decreased endurance, decreased mobility, difficulty walking, decreased ROM, decreased strength, decreased safety awareness, postural dysfunction, and pain.   ACTIVITY LIMITATIONS: carrying, lifting, bending, sitting, standing, squatting, stairs, transfers, and locomotion level  PARTICIPATION LIMITATIONS: meal prep, cleaning, shopping, community activity, and yard work  PERSONAL FACTORS: Age, Past/current experiences, Time since onset of injury/illness/exacerbation, and 1-2 comorbidities: see pmHx are also affecting patient's functional outcome.   REHAB POTENTIAL: Fair  CLINICAL DECISION MAKING: Evolving/moderate complexity  EVALUATION COMPLEXITY: Moderate   GOALS: Goals reviewed with patient? Yes  SHORT TERM GOALS: Target date: 11/16/24  Pt will tolerate full aquatic sessions consistently without increase in pain and with improving function to demonstrate good toleration and effectiveness of intervention.  Baseline: Goal status: INITIAL  2.  Pt will complete 10 consecutive STS from water  bench onto water  stepp without ue support Baseline:  Goal status: INITIAL  3.  Pt will use alternating patterns negotiating 7 steps into and out of pool ue support hand rail as needed. Baseline:  Goal status: INITIAL    LONG TERM GOALS: Target date: 12/13/24  Pt to improve on ODI by 13% to demonstrate statistically significant Improvement in function. (MCID 13-15%) Baseline: 17/50=34% Goal status: INITIAL  2.  Pt will improve on Berg balance test to >/= 36/56 to demonstrate a decrease in fall risk. (MDC 7) Baseline: 26/56 medium fall risk Goal status: INITIAL  3.  Pt will improve strength in hips by 19 lbs to demonstrate  improved overall physical function Baseline:  Goal status: INITIAL  4.  Pt will report decrease in pain by at least 50% for improved toleration to activity/quality of life and to demonstrate improved management of pain. Baseline:  Goal status: INITIAL  5.  Pt will consider gaining pool access for use of the properties of water  for chronic conditions  maintaining mobility and minimizing pain. Baseline:  Goal status: INITIAL    PLAN:  PT FREQUENCY: 1-2x/week  PT DURATION: 8 weeks extended 2 weeks due to scheduling conflicts through holidays  PLANNED INTERVENTIONS: 97164- PT Re-evaluation, 97750- Physical Performance Testing, 97110-Therapeutic exercises, 97530- Therapeutic activity, 97112- Neuromuscular re-education, 97535- Self Care, [id number]- Manual therapy, [phone]- Gait training, [phone]- Aquatic Therapy, [phone]- Electrical stimulation (unattended), [phone]- Electrical stimulation (manual), [id number]- Ionotophoresis 4mg /ml Dexamethasone , 79439 (1-2 muscles), 20561 (3+ muscles)- Dry Needling, Patient/Family education, Balance training, Stair training, Taping, Joint mobilization, DME instructions, Cryotherapy, and Moist heat.  PLAN FOR NEXT SESSION: aquatic only: le/core strengthening and stretching increased focus R ankle; balance retraining  Delon Aquas, PTA 10/23/2024 9:29 AM [facility] [address] [place], [place], [address] Phone: [phone]   Fax:  [phone]

## 2024-10-28 ENCOUNTER — Encounter (HOSPITAL_BASED_OUTPATIENT_CLINIC_OR_DEPARTMENT_OTHER): Payer: Self-pay | Admitting: Physical Therapy

## 2024-10-28 ENCOUNTER — Ambulatory Visit (HOSPITAL_BASED_OUTPATIENT_CLINIC_OR_DEPARTMENT_OTHER): Payer: Self-pay | Admitting: Physical Therapy

## 2024-10-28 DIAGNOSIS — M6281 Muscle weakness (generalized): Secondary | ICD-10-CM

## 2024-10-28 DIAGNOSIS — M5459 Other low back pain: Secondary | ICD-10-CM | POA: Diagnosis not present

## 2024-10-28 DIAGNOSIS — R293 Abnormal posture: Secondary | ICD-10-CM

## 2024-10-28 NOTE — Therapy (Signed)
 " OUTPATIENT PHYSICAL THERAPY THORACOLUMBAR TREATMENT   Patient Name: Leonard Burke MRN: [id number] DOB:Jun 30, 1951, 73 y.o., male Today's Date: 10/28/2024  END OF SESSION:  PT End of Session - 10/28/24 1338     Visit Number 3    Date for Recertification  12/13/24    Authorization Type [employer] mcr    PT Start Time 1315    PT Stop Time 1400    PT Time Calculation (min) 45 min    Activity Tolerance Patient tolerated treatment well    Behavior During Therapy WFL for tasks assessed/performed            Past Medical History:  Diagnosis Date   Arthritis    Asthma    Elevated PSA    HTN (hypertension)    Hypercholesteremia    Hypertriglyceridemia    Prostate cancer (HCC)    Past Surgical History:  Procedure Laterality Date   COLONOSCOPY     CYST EXCISION     KNEE ARTHROSCOPY     PROSTATE BIOPSY     SHOULDER ARTHROSCOPY Bilateral    TOTAL KNEE ARTHROPLASTY Left 12/09/2021   Procedure: TOTAL KNEE ARTHROPLASTY;  Surgeon: Ernie Cough, MD;  Location: [facility];  Service: Orthopedics;  Laterality: Left;   TOTAL KNEE ARTHROPLASTY Right 02/03/2022   Procedure: TOTAL KNEE ARTHROPLASTY;  Surgeon: Ernie Cough, MD;  Location: [facility];  Service: Orthopedics;  Laterality: Right;   Patient Active Problem List   Diagnosis Date Noted   Acute hypoxic respiratory failure (HCC) 12/01/2023   S/P total knee arthroplasty, right 02/03/2022   S/P total knee arthroplasty, left 12/09/2021   Malignant neoplasm of prostate (HCC) 06/23/2021   HTN (hypertension)    Hypertriglyceridemia     PCP: Elspeth Gleason MD  REFERRING PROVIDER: Donaciano Sprang MD  REFERRING DIAG: M54.50 (ICD-10-CM) - Low back pain, unspecified   Rationale for Evaluation and Treatment: Rehabilitation  THERAPY DIAG:  Other low back pain  Muscle weakness (generalized)  Abnormal posture  ONSET DATE: 2 yrs  SUBJECTIVE:                                                                                                                                                                                            SUBJECTIVE STATEMENT: Pt has questions regarding membership at [facility] for access to pool and gym on regular basis. Felt good after last session a little tired   POOL ACCESS:  Initial evaluation: I have a lot going on need to have prostrate ca treatment. Will need to stop therapy in feb. Right now I am doing nothing exercise wise.  My weight shot up.  Had  shots in back with some relief but not much.  Have had both knees and shoulder replacements in past 2 years. I have foot drop right maybe for years didn't realize it. Waiting for a brace.  Have been tripping over it for years thought I was just clumsy.  PERTINENT HISTORY:  As per Dr Burnetta: . SABRA Want to try conservative treatment with aquatic therapy . . .as surgical intervention which more than likely would be a two-level spinal fusion   Bilateral TKR and TSR  PAIN:  Are you having pain? Yes: NPRS scale: current 2/10 Pain location: LB, hips  Pain description: ache Aggravating factors: 1st thing in am; bending, twisting Relieving factors: movement  PRECAUTIONS: None   WEIGHT BEARING RESTRICTIONS: No  FALLS:  Has patient fallen in last 6 months? Yes. Number of falls 4  tripping up steps  OCCUPATION: retired  PLOF: Independent  PATIENT GOALS: reduce LBP and increase strength  NEXT MD VISIT: as needed  OBJECTIVE:  Note: Objective measures were completed at Evaluation unless otherwise noted.  DIAGNOSTIC FINDINGS:  None in chart  PATIENT SURVEYS:  ODI: 17/50=34%  COGNITION: Overall cognitive status: Within functional limits for tasks assessed      MUSCLE LENGTH: Hamstrings: tested in sitting: right hamstring tight>Left   POSTURE: rounded shoulders, forward head, right pelvic obliquity, and flexed trunk   PALPATION: Mild TTP aong belt line equally L/R  LUMBAR ROM:   AROM eval  Flexion FT to patella  Extension 0  Right  lateral flexion Full   Left lateral flexion full  Right rotation   Left rotation    (Blank rows = not tested)  LOWER EXTREMITY ROM:     Active  Right eval Left eval  Hip flexion    Hip extension    Hip abduction    Hip adduction tight tight  Hip internal rotation 20d wfl  Hip external rotation    Knee flexion    Knee extension -8 -2  Ankle dorsiflexion    Ankle plantarflexion    Ankle inversion    Ankle eversion     (Blank rows = not tested)  LOWER EXTREMITY MMT:    MMT Right eval Left eval  Hip flexion 30.0 34.7  Hip extension    Hip abduction 17.5 12.7  Hip adduction    Hip internal rotation    Hip external rotation    Knee flexion    Knee extension    Ankle dorsiflexion 1 4  Ankle plantarflexion    Ankle inversion    Ankle eversion     (Blank rows = not tested)  LUMBAR SPECIAL TESTS:  Slump test: Negative  FUNCTIONAL TESTS:  5 times sit to stand: from pool bench: 19.48 Timed up and go (TUG): 18.61    Item Test date: 10/16/24 Date:  Date:   Sitting to standing 3. able to stand independently using hands Insert SmartPhrase OPRCBERGREEVAL Insert SmartPhrase OPRCBERGREEVAL  2. Standing unsupported 4. able to stand safely for 2 minutes    3. Sitting with back unsupported, feet supported 4. able to sit safely and securely for 2 minutes    4. Standing to sitting 3. controls descent by using hands    5. Pivot transfer  3. able to transfer safely with definite need of hands    6. Standing unsupported with eyes closed 1. unable to keep eyes closed 3 seconds but stays standing safely    7. Standing unsupported with feet together 0. needs help to attain position  and unable to hold for 15 seconds    8. Reaching forward with outstretched arms while standing 2. can reach forward 5 cm (2 inches)    9. Pick up object from the floor from standing 2. unable to pick up but reaches 2-5 cm (1-2 inches) from slipper and keeps balance independently    10. Turning to look behind  over left and right shoulders while standing 2. turns sideways but only maintains balance    11. Turn 360 degrees 2. able to turn 360 degrees safely but slowly    12. Place alternate foot on step or stool while standing unsupported 0. needs assistance to keep from falling/unable to try    13. Standing unsupported one foot in front 0. loses balance while stepping or standing    14. Standing on one leg 0. unable to try of needs assist to prevent fall      Total Score 26/56 Total Score:    Total Score:     GAIT: Distance walked: 500 ft Assistive device utilized: None Level of assistance: Complete Independence Comments: cadence slowed, shortened step length, right leaning, reduced hip flex through swing  TREATMENT  [facility] Adult PT Treatment:                                             Date: 10/23/24 Pt seen for aquatic therapy today.  Treatment took place in water  3.5-4.75 ft in depth at the [facility] pool. Temp of water  was 91.  Pt entered/exited the pool via  stairs with bil rail.  Selfcare: pt questions about fitness center and its use for optimal health through prostate ca treatments   - supported with barbell walking forward/ backward in 4+ ft water -> without support, with reciprocal arm swing - unsupported side stepping -> with arm add/abdct with rainbow hand floats - L stretch not tolerated difficulty breathing in L position - suitcase carry with bil rainbow hand floats at side, marching forward/ backward->unilateral - standing hamstring stretch with heel on 2nd step, 15s x 2 each LE - TrA set with solid pull down to thighs -> in staggered stance   - UE support yellow HB:  toe/heel raises x10; hip add/abd x10 ; hip add/abd x5  - ue support on wall: hip flex/ext - return to walking forward, unsupported     Pt requires the buoyancy and hydrostatic pressure of water  for support, and to offload joints by unweighting joint load by at least 50 % in navel deep water  and by at  least 75-80% in chest to neck deep water .  Viscosity of the water  is needed for resistance of strengthening. Water  current perturbations provides challenge to standing balance requiring increased core activation.                                                                                                            PATIENT EDUCATION:  Education details: intro to aquatic  therapy  Person educated: Patient Education method: Explanation Education comprehension: verbalized understanding  HOME EXERCISE PROGRAM: Aquatic TBA  ASSESSMENT:  CLINICAL IMPRESSION: Pt demonstrates improved acclimation to pool balancing with improvement in staggered stance although he is greatly challenged with all balance tasks throughout session.  He does not tolerate L position as he reports some difficulty breathing. He reports no pain but HS tightness.  Goals ongoing     Initial impression:  Patient is a 73 y.o. m who was seen today for physical therapy evaluation and treatment for LBP.He presents to aquatic setting today amb without AD and reports of pending prostate ca treatments, uncertain of his ability to participate in PT.  Patient presents with pain limited deficits in LB which limit  lumbar ROM particularly in extension, endurance, activity tolerance, gait, balance, strength and functional mobility with ADL's.  There  are no diagnostics in chart but as per note by Dr Burnetta he may require a 2 level spinal fusion if aquatic intervention not successful as he has had failed land based PT treatment causing increase in pain. Patient is having to modify and restrict ADL's as indicated by outcome measure score, subjective information and objective measures which is affecting overall participation. He will benefit from skilled aquatic PT to improve all areas of deficits.     OBJECTIVE IMPAIRMENTS: Abnormal gait, decreased activity tolerance, decreased balance, decreased endurance, decreased mobility,  difficulty walking, decreased ROM, decreased strength, decreased safety awareness, postural dysfunction, and pain.   ACTIVITY LIMITATIONS: carrying, lifting, bending, sitting, standing, squatting, stairs, transfers, and locomotion level  PARTICIPATION LIMITATIONS: meal prep, cleaning, shopping, community activity, and yard work  PERSONAL FACTORS: Age, Past/current experiences, Time since onset of injury/illness/exacerbation, and 1-2 comorbidities: see pmHx are also affecting patient's functional outcome.   REHAB POTENTIAL: Fair  CLINICAL DECISION MAKING: Evolving/moderate complexity  EVALUATION COMPLEXITY: Moderate   GOALS: Goals reviewed with patient? Yes  SHORT TERM GOALS: Target date: 11/16/24  Pt will tolerate full aquatic sessions consistently without increase in pain and with improving function to demonstrate good toleration and effectiveness of intervention.  Baseline: Goal status: INITIAL  2.  Pt will complete 10 consecutive STS from water  bench onto water  stepp without ue support Baseline:  Goal status: INITIAL  3.  Pt will use alternating patterns negotiating 7 steps into and out of pool ue support hand rail as needed. Baseline:  Goal status: INITIAL    LONG TERM GOALS: Target date: 12/13/24  Pt to improve on ODI by 13% to demonstrate statistically significant Improvement in function. (MCID 13-15%) Baseline: 17/50=34% Goal status: INITIAL  2.  Pt will improve on Berg balance test to >/= 36/56 to demonstrate a decrease in fall risk. (MDC 7) Baseline: 26/56 medium fall risk Goal status: INITIAL  3.  Pt will improve strength in hips by 19 lbs to demonstrate improved overall physical function Baseline:  Goal status: INITIAL  4.  Pt will report decrease in pain by at least 50% for improved toleration to activity/quality of life and to demonstrate improved management of pain. Baseline:  Goal status: INITIAL  5.  Pt will consider gaining pool access for use of the  properties of water  for chronic conditions maintaining mobility and minimizing pain. Baseline:  Goal status: INITIAL    PLAN:  PT FREQUENCY: 1-2x/week  PT DURATION: 8 weeks extended 2 weeks due to scheduling conflicts through holidays  PLANNED INTERVENTIONS: 97164- PT Re-evaluation, 97750- Physical Performance Testing, 97110-Therapeutic exercises, 97530- Therapeutic activity, [id number]- Neuromuscular re-education, 97535- Self  Care, 02859- Manual therapy, [phone]- Gait training, [phone]- Aquatic Therapy, [phone]- Electrical stimulation (unattended), [phone]- Electrical stimulation (manual), [id number]- Ionotophoresis 4mg /ml Dexamethasone , 20560 (1-2 muscles), 20561 (3+ muscles)- Dry Needling, Patient/Family education, Balance training, Stair training, Taping, Joint mobilization, DME instructions, Cryotherapy, and Moist heat.  PLAN FOR NEXT SESSION: aquatic only: le/core strengthening and stretching increased focus R ankle; balance retraining  Ronal Foots) Valary Manahan MPT 10/28/2024 2:04 PM [facility] [address] [place], [place], [address] Phone: [phone]   Fax:  [phone]  "

## 2024-11-05 NOTE — Progress Notes (Signed)
 RN left message for call back to confirm patient has started Orgovyx.

## 2024-11-06 ENCOUNTER — Ambulatory Visit: Attending: Internal Medicine | Admitting: Emergency Medicine

## 2024-11-06 ENCOUNTER — Encounter: Payer: Self-pay | Admitting: Nurse Practitioner

## 2024-11-06 VITALS — BP 126/82 | HR 67 | Ht 69.0 in | Wt 242.0 lb

## 2024-11-06 DIAGNOSIS — I700 Atherosclerosis of aorta: Secondary | ICD-10-CM | POA: Diagnosis not present

## 2024-11-06 DIAGNOSIS — E782 Mixed hyperlipidemia: Secondary | ICD-10-CM

## 2024-11-06 DIAGNOSIS — I10 Essential (primary) hypertension: Secondary | ICD-10-CM

## 2024-11-06 DIAGNOSIS — R0683 Snoring: Secondary | ICD-10-CM | POA: Diagnosis not present

## 2024-11-06 DIAGNOSIS — I2510 Atherosclerotic heart disease of native coronary artery without angina pectoris: Secondary | ICD-10-CM

## 2024-11-06 DIAGNOSIS — R400 Somnolence: Secondary | ICD-10-CM

## 2024-11-06 DIAGNOSIS — I288 Other diseases of pulmonary vessels: Secondary | ICD-10-CM | POA: Diagnosis not present

## 2024-11-06 NOTE — Assessment & Plan Note (Signed)
 BPs reported well controled Continue amlodipine  10 mg daily

## 2024-11-06 NOTE — Patient Instructions (Addendum)
 Medication Instructions:  Your physician recommends that you continue on your current medications as directed. Please refer to the Current Medication list given to you today.  *If you need a refill on your cardiac medications before your next appointment, please call your pharmacy*  Lab Work:Testing/Procedures: Fasting lipid and lfts with 1 week  Follow-Up: At [facility], you and your health needs are our priority.  As part of our continuing mission to provide you with exceptional heart care, our providers are all part of one team.  This team includes your primary Cardiologist (physician) and Advanced Practice Providers or APPs (Physician Assistants and Nurse Practitioners) who all work together to provide you with the care you need, when you need it.  Your next appointment:   1 year(s)  Provider:   Dr. Sheena  We recommend signing up for the patient portal called MyChart.  Sign up information is provided on this After Visit Summary.  MyChart is used to connect with patients for Virtual Visits (Telemedicine).  Patients are able to view lab/test results, encounter notes, upcoming appointments, etc.  Non-urgent messages can be sent to your provider as well.   To learn more about what you can do with MyChart, go to [email or web address].

## 2024-11-06 NOTE — Progress Notes (Signed)
 " Cardiology Office Note:    Date:  11/06/2024   ID:  Leonard Burke, DOB 1951/01/27, MRN [id number]  PCP:  Nanci Senior, MD   [facility] Providers Cardiologist:  Dub Huntsman, DO     Referring MD: Nanci Senior, MD   Chief complaint: Follow-up recent NM PET test     History of Present Illness:   Leonard Burke is a 73 y.o. male with a hx of HTN, HLD, prediabetes, aortic atherosclerosis, asthma presents for follow-up of elevated troponins during hospitalization in January.  Presented to the ED 12/01/2023 with complaints of chest pain, nonacute EKG, troponin 12->28, CXR with left lower lobe infiltrates and enlarged cardiac silhouette.  Hypoxic with oxygen saturations in the 80s, CT angio PE negative for pulmonary embolism, with concern for pneumonia.  Cardiology consulted, determined no need for further evaluation unless chest pain continued following pneumonia.  Echocardiogram with LVEF 65-70%, no RWMA, moderate concentric LVH, indeterminate diastolic parameters, normal mitral valve, trivial AV regurgitation, AV sclerosis present, without evidence of stenosis.  Treated with multiple antibiotics, sent home on home O2 following desaturation, with plans for outpatient cardiology follow-up.  Was seen by Dr. Huntsman on 12/13/2023, no further complaints at that time.  Patient received a NM PET skull to mid-thigh scan 10/01/2024 to assess for prostate cancer screening. This demonstrated bilateral carotid atherosclerosis. Thoracic aortic and coronary artery atherosclerosis. Abdominal aortic atherosclerosis. Mild cardiomegaly. Pulmonary artery enlargement, outflow tract 3.4 cm. Pulmonary artery enlargement suggests pulmonary arterial hypertension.  Presents independently. Appears stable from a cardiovascular standpoint. He denies chest pain, palpitations, dyspnea, orthopnea, n, v,  dark/tarry/bloody stools, hematuria, dizziness, syncope, edema, weight gain. He reports fatigue that has  been on-going for years, has been taking more naps over the last year, reports snoring and history of PND. Denies previous work up for sleep apnea. Trying to get more active. Has started water  aerobics, has attended two classes in the past week, able to complete the exercises without fatigue.   ROS:   Please see the history of present illness.    All other systems reviewed and are negative.     Past Medical History:  Diagnosis Date   Arthritis    Asthma    Elevated PSA    HTN (hypertension)    Hypercholesteremia    Hypertriglyceridemia    Prostate cancer (HCC)     Past Surgical History:  Procedure Laterality Date   COLONOSCOPY     CYST EXCISION     KNEE ARTHROSCOPY     PROSTATE BIOPSY     SHOULDER ARTHROSCOPY Bilateral    TOTAL KNEE ARTHROPLASTY Left 12/09/2021   Procedure: TOTAL KNEE ARTHROPLASTY;  Surgeon: Ernie Cough, MD;  Location: [facility];  Service: Orthopedics;  Laterality: Left;   TOTAL KNEE ARTHROPLASTY Right 02/03/2022   Procedure: TOTAL KNEE ARTHROPLASTY;  Surgeon: Ernie Cough, MD;  Location: [facility];  Service: Orthopedics;  Laterality: Right;    Current Medications: Active Medications[1]   Allergies:   Codeine, Penicillins, Ivp dye [iodinated contrast media], and Multihance  [gadobenate]   Social History   Socioeconomic History   Marital status: Married    Spouse name: Not on file   Number of children: Not on file   Years of education: Not on file   Highest education level: Not on file  Occupational History   Not on file  Tobacco Use   Smoking status: Former    Types: Cigarettes   Smokeless tobacco: Never  Vaping Use   Vaping status:  Never Used  Substance and Sexual Activity   Alcohol use: Yes    Alcohol/week: 5.0 standard drinks of alcohol    Types: 5 Cans of beer per week   Drug use: No   Sexual activity: Not Currently  Other Topics Concern   Not on file  Social History Narrative   Not on file   Social Drivers of Health   Tobacco Use:  Medium Risk (11/06/2024)   Patient History    Smoking Tobacco Use: Former    Smokeless Tobacco Use: Never    Passive Exposure: Not on Actuary Strain: Not on file  Food Insecurity: No Food Insecurity (10/01/2024)   Epic    Worried About Programme Researcher, Broadcasting/film/video in the Last Year: Never true    Ran Out of Food in the Last Year: Never true  Transportation Needs: No Transportation Needs (10/01/2024)   Epic    Lack of Transportation (Medical): No    Lack of Transportation (Non-Medical): No  Physical Activity: Not on file  Stress: Not on file  Social Connections: Socially Isolated (12/02/2023)   Social Connection and Isolation Panel    Frequency of Communication with Friends and Family: Three times a week    Frequency of Social Gatherings with Friends and Family: Once a week    Attends Religious Services: Never    Database Administrator or Organizations: No    Attends Banker Meetings: Never    Marital Status: Divorced  Depression (PHQ2-9): Low Risk (10/01/2024)   Depression (PHQ2-9)    PHQ-2 Score: 0  Alcohol Screen: Not on file  Housing: Unknown (10/01/2024)   Epic    Unable to Pay for Housing in the Last Year: No    Number of Times Moved in the Last Year: Not on file    Homeless in the Last Year: No  Utilities: Not At Risk (12/02/2023)   [employer] Utilities    Threatened with loss of utilities: No  Health Literacy: Not on file     Family History: The patient's family history includes Prostate cancer in his father. There is no history of Other.  EKGs/Labs/Other Studies Reviewed:    The following studies were reviewed today:  EKG Interpretation Date/Time:  Wednesday November 06 2024 13:41:15 EST Ventricular Rate:  67 PR Interval:  246 QRS Duration:  118 QT Interval:  416 QTC Calculation: 439 R Axis:   -63  Text Interpretation: Sinus rhythm with 1st degree A-V block Left anterior fascicular block Minimal voltage criteria for LVH, may be normal variant  ( Cornell product ) No significant change from previous studies Confirmed by Veleda Mun [phone]) on 11/06/2024 1:52:32 PM    Recent Labs: 12/01/2023: B Natriuretic Peptide 51.5 12/02/2023: Magnesium 2.1 12/03/2023: BUN 26; Creatinine, Ser 1.07; Hemoglobin 12.2; Platelets 153; Potassium 4.2; Sodium 138  Recent Lipid Panel    Component Value Date/Time   CHOL 166 12/02/2023 0032   TRIG 405 (H) 12/02/2023 0032   HDL 29 (L) 12/02/2023 0032   CHOLHDL 5.7 12/02/2023 0032   VLDL UNABLE TO CALCULATE IF TRIGLYCERIDE OVER 400 mg/dL 98/74/7974 9967   LDLCALC UNABLE TO CALCULATE IF TRIGLYCERIDE OVER 400 mg/dL 98/74/7974 9967   LDLDIRECT 94 12/02/2023 0032     Risk Assessment/Calculations:           STOP-Bang Score:  8       Physical Exam:    VS:  BP 126/82 (BP Location: Left Arm, Patient Position: Sitting, Cuff Size: Normal)  Pulse 67   Ht 5' 9 (1.753 m)   Wt 242 lb (109.8 kg)   SpO2 92%   BMI 35.74 kg/m        Wt Readings from Last 3 Encounters:  11/06/24 242 lb (109.8 kg)  10/01/24 241 lb 9.6 oz (109.6 kg)  09/11/24 240 lb (108.9 kg)     GEN:  Well nourished, well developed in no acute distress HEENT: Normal NECK: No JVD; No carotid bruits CARDIAC:  S1-S2 normal, RRR, no murmurs, rubs, gallops RESPIRATORY:  Clear to auscultation without rales, wheezing or rhonchi  MUSCULOSKELETAL:  Trace bilateral edema, 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 Thoracic aortic and coronary artery atherosclerosis. Abdominal aortic atherosclerosis. Mild cardiomegaly. Pulmonary artery enlargement, outflow tract 3.4 cm. Pulmonary artery enlargement suggests pulmonary arterial  hypertension.  EKG: Sinus rhythm with 1st degree A-V block Left anterior fascicular block Minimal voltage criteria for LVH, may be normal variant, no significant change from prior studies, 67 bpm Denies CP, SOB, orthopnea, near syncope palpitations, edema Able to achieve functional 4.73 METS. Active in water  aerobics without symptoms. Will plan to focus on secondary prevention of CAD, continue to monitor for new symptoms Will order fasting lipids and LFTs and adjust lipid therapy as appropriate. Patient does not wish to take aspirin , stopped at some point earlier this year.  Continue atenolol  50 mg daily, continue crestor  5 mg daily Aortic atherosclerosis Noted on imaging as above Updating fasting lipids, will adjust meds as below Mixed hyperlipidemia Updating fasting lipids and LFTs today LDL goal <70 Continue crestor  5 mg daily Primary hypertension BPs reported well controled Continue amlodipine  10 mg daily Enlarged pulmonary artery (HCC) NM PET 09/30/2024: Pulmonary artery enlargement, outflow tract 3.4 cm. Pulmonary artery enlargement suggests pulmonary arterial hypertension.  Reports SOB during asthma flares, none otherwise Does not follow with pulmonology for asthma Referral as below Snoring Daytime sleepiness STOP-BANG = 8 Patient reports years of being told he has a hx of snoring, intermittent pauses to breathing during sleep, and has recently been experiencing more daytime sleepiness/taking naps No prior sleep study/work up for OSA previously Will refer to pulmonology for further evaluation  Disposition: Follow up in one year or sooner if necessary. Proceed to the ED with any new/worsening symptoms            Medication Adjustments/Labs and Tests Ordered: Current medicines are reviewed at length with the patient today.  Concerns regarding medicines are outlined above.  Orders Placed This Encounter  Procedures   Lipid panel   Hepatic function panel   Ambulatory  referral to Pulmonology   EKG 12-Lead   No orders of the defined types were placed in this encounter.   Patient Instructions  Medication Instructions:  Your physician recommends that you continue on your current medications as directed. Please refer to the Current Medication list given to you today.  *If you need a refill on your cardiac medications before your next appointment, please call your pharmacy*  Lab Work:Testing/Procedures: Fasting lipid and lfts with 1 week  Follow-Up: At [facility], you and your health needs are our priority.  As part of our continuing mission to provide you with exceptional heart care, our  providers are all part of one team.  This team includes your primary Cardiologist (physician) and Advanced Practice Providers or APPs (Physician Assistants and Nurse Practitioners) who all work together to provide you with the care you need, when you need it.  Your next appointment:   1 year(s)  Provider:   Dr. Sheena  We recommend signing up for the patient portal called MyChart.  Sign up information is provided on this After Visit Summary.  MyChart is used to connect with patients for Virtual Visits (Telemedicine).  Patients are able to view lab/test results, encounter notes, upcoming appointments, etc.  Non-urgent messages can be sent to your provider as well.   To learn more about what you can do with MyChart, go to [email or web address].              Signed, Loreley Schwall E Deserae Jennings, NP  11/06/2024 4:11 PM    [facility]     [1]  Current Meds  Medication Sig   acetaminophen  (TYLENOL ) 325 MG tablet Take 2 tablets (650 mg total) by mouth every 6 (six) hours as needed for mild pain (pain score 1-3), fever or headache.   amLODipine  (NORVASC ) 10 MG tablet Take 10 mg by mouth daily.   atenolol  (TENORMIN ) 50 MG tablet Take 1 tablet (50 mg total) by mouth daily.   diphenhydramine -acetaminophen  (TYLENOL  PM) 25-500 MG TABS tablet Take 1 tablet  by mouth at bedtime as needed (Sleep related symptoms).   fenofibrate  (TRICOR ) 145 MG tablet Take 1 tablet (145 mg total) by mouth daily.   Fluticasone-Salmeterol 113-14 MCG/ACT AEPB Inhale 1 puff into the lungs 2 (two) times daily.   Naphazoline-Polyethyl Glycol (EYE DROPS) 0.012-0.2 % SOLN Place 1 drop into both eyes daily as needed (Allergies/dry eyes).   polyethylene glycol (MIRALAX  / GLYCOLAX ) 17 g packet Take 17 g by mouth daily as needed for mild constipation.   relugolix (ORGOVYX) 120 MG tablet Take 120 mg by mouth daily.   "

## 2024-11-12 ENCOUNTER — Other Ambulatory Visit: Payer: Self-pay | Admitting: Urology

## 2024-11-20 ENCOUNTER — Other Ambulatory Visit: Payer: Self-pay | Admitting: Urology

## 2024-11-20 ENCOUNTER — Ambulatory Visit (HOSPITAL_BASED_OUTPATIENT_CLINIC_OR_DEPARTMENT_OTHER): Attending: Orthopedic Surgery | Admitting: Physical Therapy

## 2024-11-20 ENCOUNTER — Encounter (HOSPITAL_BASED_OUTPATIENT_CLINIC_OR_DEPARTMENT_OTHER): Payer: Self-pay | Admitting: Physical Therapy

## 2024-11-20 DIAGNOSIS — R293 Abnormal posture: Secondary | ICD-10-CM | POA: Diagnosis present

## 2024-11-20 DIAGNOSIS — M5459 Other low back pain: Secondary | ICD-10-CM | POA: Insufficient documentation

## 2024-11-20 DIAGNOSIS — M6281 Muscle weakness (generalized): Secondary | ICD-10-CM | POA: Diagnosis present

## 2024-11-20 DIAGNOSIS — C61 Malignant neoplasm of prostate: Secondary | ICD-10-CM

## 2024-11-20 NOTE — Therapy (Signed)
 " OUTPATIENT PHYSICAL THERAPY THORACOLUMBAR TREATMENT   Patient Name: Leonard Burke MRN: [id number] DOB:09-27-1951, 74 y.o., male Today's Date: 11/20/2024  END OF SESSION:  PT End of Session - 11/20/24 1348     Visit Number 4    Date for Recertification  12/13/24    Authorization Type [employer] mcr    PT Start Time 1345    PT Stop Time 1425    PT Time Calculation (min) 40 min    Activity Tolerance Patient tolerated treatment well    Behavior During Therapy WFL for tasks assessed/performed            Past Medical History:  Diagnosis Date   Arthritis    Asthma    Elevated PSA    HTN (hypertension)    Hypercholesteremia    Hypertriglyceridemia    Prostate cancer (HCC)    Past Surgical History:  Procedure Laterality Date   COLONOSCOPY     CYST EXCISION     KNEE ARTHROSCOPY     PROSTATE BIOPSY     SHOULDER ARTHROSCOPY Bilateral    TOTAL KNEE ARTHROPLASTY Left 12/09/2021   Procedure: TOTAL KNEE ARTHROPLASTY;  Surgeon: Ernie Cough, MD;  Location: [facility];  Service: Orthopedics;  Laterality: Left;   TOTAL KNEE ARTHROPLASTY Right 02/03/2022   Procedure: TOTAL KNEE ARTHROPLASTY;  Surgeon: Ernie Cough, MD;  Location: [facility];  Service: Orthopedics;  Laterality: Right;   Patient Active Problem List   Diagnosis Date Noted   Acute hypoxic respiratory failure (HCC) 12/01/2023   S/P total knee arthroplasty, right 02/03/2022   S/P total knee arthroplasty, left 12/09/2021   Malignant neoplasm of prostate (HCC) 06/23/2021   HTN (hypertension)    Hypertriglyceridemia     PCP: Elspeth Gleason MD  REFERRING PROVIDER: Donaciano Sprang MD  REFERRING DIAG: M54.50 (ICD-10-CM) - Low back pain, unspecified   Rationale for Evaluation and Treatment: Rehabilitation  THERAPY DIAG:  Other low back pain  Muscle weakness (generalized)  Abnormal posture  ONSET DATE: 2 yrs  SUBJECTIVE:                                                                                                                                                                                            SUBJECTIVE STATEMENT: Pt reports he had some fatigue in LEs after last session; stopped in lobby and rested before heading to car.  Pt interested in a few more appts before starting cancer treatment at end of February.    POOL ACCESS:  Initial evaluation: I have a lot going on need to have prostrate ca treatment. Will need to stop therapy in feb. Right now  I am doing nothing exercise wise.  My weight shot up.  Had shots in back with some relief but not much.  Have had both knees and shoulder replacements in past 2 years. I have foot drop right maybe for years didn't realize it. Waiting for a brace.  Have been tripping over it for years thought I was just clumsy.  PERTINENT HISTORY:  As per Dr Burnetta: . SABRA Want to try conservative treatment with aquatic therapy . . .as surgical intervention which more than likely would be a two-level spinal fusion   Bilateral TKR and TSR  PAIN:  Are you having pain? Yes: NPRS scale: current 2/10 Pain location: LB, hips  Pain description: ache Aggravating factors: 1st thing in am; bending, twisting Relieving factors: movement  PRECAUTIONS: None   WEIGHT BEARING RESTRICTIONS: No  FALLS:  Has patient fallen in last 6 months? Yes. Number of falls 4  tripping up steps  OCCUPATION: retired  PLOF: Independent  PATIENT GOALS: reduce LBP and increase strength  NEXT MD VISIT: as needed  OBJECTIVE:  Note: Objective measures were completed at Evaluation unless otherwise noted.  DIAGNOSTIC FINDINGS:  None in chart  PATIENT SURVEYS:  ODI: 17/50=34%  COGNITION: Overall cognitive status: Within functional limits for tasks assessed      MUSCLE LENGTH: Hamstrings: tested in sitting: right hamstring tight>Left   POSTURE: rounded shoulders, forward head, right pelvic obliquity, and flexed trunk   PALPATION: Mild TTP aong belt line equally L/R  LUMBAR ROM:    AROM eval  Flexion FT to patella  Extension 0  Right lateral flexion Full   Left lateral flexion full  Right rotation   Left rotation    (Blank rows = not tested)  LOWER EXTREMITY ROM:     Active  Right eval Left eval  Hip flexion    Hip extension    Hip abduction    Hip adduction tight tight  Hip internal rotation 20d wfl  Hip external rotation    Knee flexion    Knee extension -8 -2  Ankle dorsiflexion    Ankle plantarflexion    Ankle inversion    Ankle eversion     (Blank rows = not tested)  LOWER EXTREMITY MMT:    MMT Right eval Left eval  Hip flexion 30.0 34.7  Hip extension    Hip abduction 17.5 12.7  Hip adduction    Hip internal rotation    Hip external rotation    Knee flexion    Knee extension    Ankle dorsiflexion 1 4  Ankle plantarflexion    Ankle inversion    Ankle eversion     (Blank rows = not tested)  LUMBAR SPECIAL TESTS:  Slump test: Negative  FUNCTIONAL TESTS:  5 times sit to stand: from pool bench: 19.48 Timed up and go (TUG): 18.61    Item Test date: 10/16/24 Date:  Date:   Sitting to standing 3. able to stand independently using hands Insert SmartPhrase OPRCBERGREEVAL Insert SmartPhrase OPRCBERGREEVAL  2. Standing unsupported 4. able to stand safely for 2 minutes    3. Sitting with back unsupported, feet supported 4. able to sit safely and securely for 2 minutes    4. Standing to sitting 3. controls descent by using hands    5. Pivot transfer  3. able to transfer safely with definite need of hands    6. Standing unsupported with eyes closed 1. unable to keep eyes closed 3 seconds but stays standing safely  7. Standing unsupported with feet together 0. needs help to attain position and unable to hold for 15 seconds    8. Reaching forward with outstretched arms while standing 2. can reach forward 5 cm (2 inches)    9. Pick up object from the floor from standing 2. unable to pick up but reaches 2-5 cm (1-2 inches) from slipper  and keeps balance independently    10. Turning to look behind over left and right shoulders while standing 2. turns sideways but only maintains balance    11. Turn 360 degrees 2. able to turn 360 degrees safely but slowly    12. Place alternate foot on step or stool while standing unsupported 0. needs assistance to keep from falling/unable to try    13. Standing unsupported one foot in front 0. loses balance while stepping or standing    14. Standing on one leg 0. unable to try of needs assist to prevent fall      Total Score 26/56 Total Score:    Total Score:     GAIT: Distance walked: 500 ft Assistive device utilized: None Level of assistance: Complete Independence Comments: cadence slowed, shortened step length, right leaning, reduced hip flex through swing  TREATMENT  [facility] Adult PT Treatment:                                             Date: 11/20/24  Pt seen for aquatic therapy today.  Treatment took place in water  3.5-4.75 ft in depth at the [facility] pool. Temp of water  was 91.  Pt entered/exited the pool via  stairs with bil rail.  - unsupported walking forward/ backward in 4+ ft water - - unsupported side stepping -> with arm add/abdct with rainbow hand floats - suitcase carry with bil yellow hand floats at side, marching forward/ backward->unilateral - UE On wall: hip abdct/ add x 10 each; heel / toe raises;  alternating single leg clams x 10 - STS from bench in water  with feet on blue step x 10 (LOB x 3, self-corrected with UE on wall)  - TrA set with solid pull down to thighs in staggered stance  x10 each -  support on wall: straight leg kick x 10; hip extension to toe touch x 10 - staggered stance with reciprocal arm swing with light resistance bells -> standard stance with horiz abdct/add with bells-> side stepping with arm add/abdct with light resistance bells  - standing hamstring stretch with heel on 2nd step, 15s x 2 each LE   Pt requires the buoyancy and  hydrostatic pressure of water  for support, and to offload joints by unweighting joint load by at least 50 % in navel deep water  and by at least 75-80% in chest to neck deep water .  Viscosity of the water  is needed for resistance of strengthening. Water  current perturbations provides challenge to standing balance requiring increased core activation.  PATIENT EDUCATION:  Education details: intro to aquatic therapy  Person educated: Patient Education method: Explanation Education comprehension: verbalized understanding  HOME EXERCISE PROGRAM: Aquatic TBA  ASSESSMENT:  CLINICAL IMPRESSION: Pt able to complete 7 of 10 STS from bench in water  without LOB; progressing well towards STG 2.  He has met STG1 with good tolerance for sessions.  Will plan to check STG3 next session (reciprocal pattern up/down stairs).  Pt tolerated all exercises without reports of increased symptoms or fatigue.  Will continue to progress towards established goals.      Initial impression:  Patient is a 74 y.o. m who was seen today for physical therapy evaluation and treatment for LBP.He presents to aquatic setting today amb without AD and reports of pending prostate ca treatments, uncertain of his ability to participate in PT.  Patient presents with pain limited deficits in LB which limit  lumbar ROM particularly in extension, endurance, activity tolerance, gait, balance, strength and functional mobility with ADL's.  There  are no diagnostics in chart but as per note by Dr Burnetta he may require a 2 level spinal fusion if aquatic intervention not successful as he has had failed land based PT treatment causing increase in pain. Patient is having to modify and restrict ADL's as indicated by outcome measure score, subjective information and objective measures which is affecting overall participation. He will benefit from  skilled aquatic PT to improve all areas of deficits.     OBJECTIVE IMPAIRMENTS: Abnormal gait, decreased activity tolerance, decreased balance, decreased endurance, decreased mobility, difficulty walking, decreased ROM, decreased strength, decreased safety awareness, postural dysfunction, and pain.   ACTIVITY LIMITATIONS: carrying, lifting, bending, sitting, standing, squatting, stairs, transfers, and locomotion level  PARTICIPATION LIMITATIONS: meal prep, cleaning, shopping, community activity, and yard work  PERSONAL FACTORS: Age, Past/current experiences, Time since onset of injury/illness/exacerbation, and 1-2 comorbidities: see pmHx are also affecting patient's functional outcome.   REHAB POTENTIAL: Fair  CLINICAL DECISION MAKING: Evolving/moderate complexity  EVALUATION COMPLEXITY: Moderate   GOALS: Goals reviewed with patient? Yes  SHORT TERM GOALS: Target date: 11/16/24  Pt will tolerate full aquatic sessions consistently without increase in pain and with improving function to demonstrate good toleration and effectiveness of intervention.  Baseline: Goal status: MET - 11/20/24  2.  Pt will complete 10 consecutive STS from water  bench onto water  step without ue support Baseline:  Goal status: In progress (see note) - 11/20/24  3.  Pt will use alternating patterns negotiating 7 steps into and out of pool ue support hand rail as needed. Baseline:  Goal status: INITIAL    LONG TERM GOALS: Target date: 12/13/24  Pt to improve on ODI by 13% to demonstrate statistically significant Improvement in function. (MCID 13-15%) Baseline: 17/50=34% Goal status: INITIAL  2.  Pt will improve on Berg balance test to >/= 36/56 to demonstrate a decrease in fall risk. (MDC 7) Baseline: 26/56 medium fall risk Goal status: INITIAL  3.  Pt will improve strength in hips by 19 lbs to demonstrate improved overall physical function Baseline:  Goal status: INITIAL  4.  Pt will report  decrease in pain by at least 50% for improved toleration to activity/quality of life and to demonstrate improved management of pain. Baseline:  Goal status: INITIAL  5.  Pt will consider gaining pool access for use of the properties of water  for chronic conditions maintaining mobility and minimizing pain. Baseline:  Goal status: INITIAL    PLAN:  PT FREQUENCY: 1-2x/week  PT DURATION:  8 weeks extended 2 weeks due to scheduling conflicts through holidays  PLANNED INTERVENTIONS: 97164- PT Re-evaluation, 97750- Physical Performance Testing, 97110-Therapeutic exercises, 97530- Therapeutic activity, 97112- Neuromuscular re-education, 97535- Self Care, 02859- Manual therapy, [phone]- Gait training, [phone]- Aquatic Therapy, [phone]- Electrical stimulation (unattended), [phone]- Electrical stimulation (manual), [id number]- Ionotophoresis 4mg /ml Dexamethasone , 20560 (1-2 muscles), 20561 (3+ muscles)- Dry Needling, Patient/Family education, Balance training, Stair training, Taping, Joint mobilization, DME instructions, Cryotherapy, and Moist heat.  PLAN FOR NEXT SESSION: aquatic only: le/core strengthening and stretching increased focus R ankle; balance retraining  Delon Aquas, PTA 11/20/2024 2:56 PM [facility] [address] [place], [place], [address] Phone: [phone]   Fax:  [phone]  "

## 2024-11-28 ENCOUNTER — Ambulatory Visit (HOSPITAL_BASED_OUTPATIENT_CLINIC_OR_DEPARTMENT_OTHER): Admitting: Physical Therapy

## 2024-11-28 ENCOUNTER — Encounter (HOSPITAL_BASED_OUTPATIENT_CLINIC_OR_DEPARTMENT_OTHER): Payer: Self-pay | Admitting: Physical Therapy

## 2024-11-28 DIAGNOSIS — R293 Abnormal posture: Secondary | ICD-10-CM

## 2024-11-28 DIAGNOSIS — M6281 Muscle weakness (generalized): Secondary | ICD-10-CM

## 2024-11-28 DIAGNOSIS — M5459 Other low back pain: Secondary | ICD-10-CM | POA: Diagnosis not present

## 2024-11-28 NOTE — Therapy (Signed)
 " OUTPATIENT PHYSICAL THERAPY THORACOLUMBAR TREATMENT   Patient Name: Leonard Burke MRN: [id number] DOB:1951/08/22, 74 y.o., male Today's Date: 11/28/2024  END OF SESSION:  PT End of Session - 11/28/24 1450     Visit Number 5    Date for Recertification  12/13/24    Authorization Type [employer] mcr    PT Start Time 1445    PT Stop Time 1524    PT Time Calculation (min) 39 min    Activity Tolerance Patient tolerated treatment well    Behavior During Therapy WFL for tasks assessed/performed            Past Medical History:  Diagnosis Date   Arthritis    Asthma    Elevated PSA    HTN (hypertension)    Hypercholesteremia    Hypertriglyceridemia    Prostate cancer (HCC)    Past Surgical History:  Procedure Laterality Date   COLONOSCOPY     CYST EXCISION     KNEE ARTHROSCOPY     PROSTATE BIOPSY     SHOULDER ARTHROSCOPY Bilateral    TOTAL KNEE ARTHROPLASTY Left 12/09/2021   Procedure: TOTAL KNEE ARTHROPLASTY;  Surgeon: Ernie Cough, MD;  Location: [facility];  Service: Orthopedics;  Laterality: Left;   TOTAL KNEE ARTHROPLASTY Right 02/03/2022   Procedure: TOTAL KNEE ARTHROPLASTY;  Surgeon: Ernie Cough, MD;  Location: [facility];  Service: Orthopedics;  Laterality: Right;   Patient Active Problem List   Diagnosis Date Noted   Acute hypoxic respiratory failure (HCC) 12/01/2023   S/P total knee arthroplasty, right 02/03/2022   S/P total knee arthroplasty, left 12/09/2021   Malignant neoplasm of prostate (HCC) 06/23/2021   HTN (hypertension)    Hypertriglyceridemia     PCP: Elspeth Gleason MD  REFERRING PROVIDER: Donaciano Sprang MD  REFERRING DIAG: M54.50 (ICD-10-CM) - Low back pain, unspecified   Rationale for Evaluation and Treatment: Rehabilitation  THERAPY DIAG:  Other low back pain  Muscle weakness (generalized)  Abnormal posture  ONSET DATE: 2 yrs  SUBJECTIVE:                                                                                                                                                                                            SUBJECTIVE STATEMENT: Pt reports that his Rt hip bursitis has been bothering him; plans to see if he can get injection in hip for relief. Back is not too bad today.   Pt reports good tolerance for last session.     POOL ACCESS:  Initial evaluation: I have a lot going on need to have prostrate ca treatment. Will need to stop therapy in  feb. Right now I am doing nothing exercise wise.  My weight shot up.  Had shots in back with some relief but not much.  Have had both knees and shoulder replacements in past 2 years. I have foot drop right maybe for years didn't realize it. Waiting for a brace.  Have been tripping over it for years thought I was just clumsy.  PERTINENT HISTORY:  As per Dr Burnetta: . SABRA Want to try conservative treatment with aquatic therapy . . .as surgical intervention which more than likely would be a two-level spinal fusion   Bilateral TKR and TSR  PAIN:  Are you having pain? Yes: NPRS scale: current 3/10 Pain location: Rt hip   Pain description: ache Aggravating factors: 1st thing in am; bending, twisting Relieving factors: movement  PRECAUTIONS: None   WEIGHT BEARING RESTRICTIONS: No  FALLS:  Has patient fallen in last 6 months? Yes. Number of falls 4  tripping up steps  OCCUPATION: retired  PLOF: Independent  PATIENT GOALS: reduce LBP and increase strength  NEXT MD VISIT: as needed  OBJECTIVE:  Note: Objective measures were completed at Evaluation unless otherwise noted.  DIAGNOSTIC FINDINGS:  None in chart  PATIENT SURVEYS:  ODI: 17/50=34%  COGNITION: Overall cognitive status: Within functional limits for tasks assessed      MUSCLE LENGTH: Hamstrings: tested in sitting: right hamstring tight>Left   POSTURE: rounded shoulders, forward head, right pelvic obliquity, and flexed trunk   PALPATION: Mild TTP aong belt line equally L/R  LUMBAR ROM:   AROM eval   Flexion FT to patella  Extension 0  Right lateral flexion Full   Left lateral flexion full  Right rotation   Left rotation    (Blank rows = not tested)  LOWER EXTREMITY ROM:     Active  Right eval Left eval  Hip flexion    Hip extension    Hip abduction    Hip adduction tight tight  Hip internal rotation 20d wfl  Hip external rotation    Knee flexion    Knee extension -8 -2  Ankle dorsiflexion    Ankle plantarflexion    Ankle inversion    Ankle eversion     (Blank rows = not tested)  LOWER EXTREMITY MMT:    MMT Right eval Left eval  Hip flexion 30.0 34.7  Hip extension    Hip abduction 17.5 12.7  Hip adduction    Hip internal rotation    Hip external rotation    Knee flexion    Knee extension    Ankle dorsiflexion 1 4  Ankle plantarflexion    Ankle inversion    Ankle eversion     (Blank rows = not tested)  LUMBAR SPECIAL TESTS:  Slump test: Negative  FUNCTIONAL TESTS:  5 times sit to stand: from pool bench: 19.48 Timed up and go (TUG): 18.61    Item Test date: 10/16/24 Date:  Date:   Sitting to standing 3. able to stand independently using hands Insert SmartPhrase OPRCBERGREEVAL Insert SmartPhrase OPRCBERGREEVAL  2. Standing unsupported 4. able to stand safely for 2 minutes    3. Sitting with back unsupported, feet supported 4. able to sit safely and securely for 2 minutes    4. Standing to sitting 3. controls descent by using hands    5. Pivot transfer  3. able to transfer safely with definite need of hands    6. Standing unsupported with eyes closed 1. unable to keep eyes closed 3 seconds but stays  standing safely    7. Standing unsupported with feet together 0. needs help to attain position and unable to hold for 15 seconds    8. Reaching forward with outstretched arms while standing 2. can reach forward 5 cm (2 inches)    9. Pick up object from the floor from standing 2. unable to pick up but reaches 2-5 cm (1-2 inches) from slipper and keeps  balance independently    10. Turning to look behind over left and right shoulders while standing 2. turns sideways but only maintains balance    11. Turn 360 degrees 2. able to turn 360 degrees safely but slowly    12. Place alternate foot on step or stool while standing unsupported 0. needs assistance to keep from falling/unable to try    13. Standing unsupported one foot in front 0. loses balance while stepping or standing    14. Standing on one leg 0. unable to try of needs assist to prevent fall      Total Score 26/56 Total Score:    Total Score:     GAIT: Distance walked: 500 ft Assistive device utilized: None Level of assistance: Complete Independence Comments: cadence slowed, shortened step length, right leaning, reduced hip flex through swing  TREATMENT  [facility] Adult PT Treatment:                                             Date: 11/28/24  Pt seen for aquatic therapy today.  Treatment took place in water  3.5-4.75 ft in depth at the [facility] pool. Temp of water  was 91.  Pt entered/exited the pool via  stairs with bil rail.  - unsupported walking forward/ backward in 4+ ft water - - unsupported side stepping -> side marching - SLS (can hold up to 6 sec each LE) - UE On rainbow hand floats: tandem gait forward/ backward  (2nd rep improved) - STS from bench in water  with feet on blue step x 10 - alternating toe taps to 1st step with intermittent UE to steady (challenge in R SLS) - suitcase carry with bil rainbow hand floats at side, marching forward/ backward->unilateral - UE On wall: hip abdct/ add x 10 each; heel / toe raises;  - TrA set with solid pull down to thighs in staggered stance  x10 each and standard stance x10 - SLS with noodle in opposite hand (improved balance) - marching with reciprocal row motion - standing hamstring stretch with heel on 2nd step, 15s x 2 each LE   Pt requires the buoyancy and hydrostatic pressure of water  for support, and to offload  joints by unweighting joint load by at least 50 % in navel deep water  and by at least 75-80% in chest to neck deep water .  Viscosity of the water  is needed for resistance of strengthening. Water  current perturbations provides challenge to standing balance requiring increased core activation.  PATIENT EDUCATION:  Education details: intro to aquatic therapy  Person educated: Patient Education method: Explanation Education comprehension: verbalized understanding  HOME EXERCISE PROGRAM: Aquatic TBA  ASSESSMENT:  CLINICAL IMPRESSION: Pt completed 10 STS at bench without LOB; has met STG 2.  Pt has met STG3 with completion of reciprocal pattern up/down.    Pt tolerated all exercises without reports of increased symptoms or fatigue.  Will continue to progress towards established goals.      Initial impression:  Patient is a 74 y.o. m who was seen today for physical therapy evaluation and treatment for LBP.He presents to aquatic setting today amb without AD and reports of pending prostate ca treatments, uncertain of his ability to participate in PT.  Patient presents with pain limited deficits in LB which limit  lumbar ROM particularly in extension, endurance, activity tolerance, gait, balance, strength and functional mobility with ADL's.  There  are no diagnostics in chart but as per note by Dr Burnetta he may require a 2 level spinal fusion if aquatic intervention not successful as he has had failed land based PT treatment causing increase in pain. Patient is having to modify and restrict ADL's as indicated by outcome measure score, subjective information and objective measures which is affecting overall participation. He will benefit from skilled aquatic PT to improve all areas of deficits.     OBJECTIVE IMPAIRMENTS: Abnormal gait, decreased activity tolerance, decreased balance, decreased  endurance, decreased mobility, difficulty walking, decreased ROM, decreased strength, decreased safety awareness, postural dysfunction, and pain.   ACTIVITY LIMITATIONS: carrying, lifting, bending, sitting, standing, squatting, stairs, transfers, and locomotion level  PARTICIPATION LIMITATIONS: meal prep, cleaning, shopping, community activity, and yard work  PERSONAL FACTORS: Age, Past/current experiences, Time since onset of injury/illness/exacerbation, and 1-2 comorbidities: see pmHx are also affecting patient's functional outcome.   REHAB POTENTIAL: Fair  CLINICAL DECISION MAKING: Evolving/moderate complexity  EVALUATION COMPLEXITY: Moderate   GOALS: Goals reviewed with patient? Yes  SHORT TERM GOALS: Target date: 11/16/24  Pt will tolerate full aquatic sessions consistently without increase in pain and with improving function to demonstrate good toleration and effectiveness of intervention.  Baseline: Goal status: MET - 11/20/24  2.  Pt will complete 10 consecutive STS from water  bench onto water  step without ue support Baseline:  Goal status: MET- 11/28/24/  3.  Pt will use alternating patterns negotiating 7 steps into and out of pool ue support hand rail as needed. Baseline:  Goal status: MET -11/28/24    LONG TERM GOALS: Target date: 12/13/24  Pt to improve on ODI by 13% to demonstrate statistically significant Improvement in function. (MCID 13-15%) Baseline: 17/50=34% Goal status: INITIAL  2.  Pt will improve on Berg balance test to >/= 36/56 to demonstrate a decrease in fall risk. (MDC 7) Baseline: 26/56 medium fall risk Goal status: INITIAL  3.  Pt will improve strength in hips by 19 lbs to demonstrate improved overall physical function Baseline:  Goal status: INITIAL  4.  Pt will report decrease in pain by at least 50% for improved toleration to activity/quality of life and to demonstrate improved management of pain. Baseline:  Goal status: INITIAL  5.  Pt  will consider gaining pool access for use of the properties of water  for chronic conditions maintaining mobility and minimizing pain. Baseline:  Goal status: INITIAL    PLAN:  PT FREQUENCY: 1-2x/week  PT DURATION: 8 weeks extended 2 weeks due to scheduling conflicts through holidays  PLANNED INTERVENTIONS: 02835- PT Re-evaluation, 97750- Physical  Performance Testing, 97110-Therapeutic exercises, 97530- Therapeutic activity, [id number]- Neuromuscular re-education, [id number]- Self Care, 02859- Manual therapy, [phone]- Gait training, [phone]- Aquatic Therapy, [phone]- Electrical stimulation (unattended), [phone]- Electrical stimulation (manual), [id number]- Ionotophoresis 4mg /ml Dexamethasone , 79439 (1-2 muscles), [id number] (3+ muscles)- Dry Needling, Patient/Family education, Balance training, Stair training, Taping, Joint mobilization, DME instructions, Cryotherapy, and Moist heat.  PLAN FOR NEXT SESSION: aquatic only: le/core strengthening and stretching increased focus R ankle; balance retraining  Delon Aquas, PTA 11/28/24 3:27 PM [facility] [address] [place], [place], [address] Phone: [phone]   Fax:  [phone]  "

## 2024-12-03 ENCOUNTER — Ambulatory Visit (HOSPITAL_BASED_OUTPATIENT_CLINIC_OR_DEPARTMENT_OTHER): Admitting: Physical Therapy

## 2024-12-05 ENCOUNTER — Ambulatory Visit (HOSPITAL_BASED_OUTPATIENT_CLINIC_OR_DEPARTMENT_OTHER): Admitting: Physical Therapy

## 2024-12-10 ENCOUNTER — Ambulatory Visit (HOSPITAL_BASED_OUTPATIENT_CLINIC_OR_DEPARTMENT_OTHER): Attending: Orthopedic Surgery | Admitting: Physical Therapy

## 2024-12-12 ENCOUNTER — Ambulatory Visit (HOSPITAL_BASED_OUTPATIENT_CLINIC_OR_DEPARTMENT_OTHER): Admitting: Physical Therapy

## 2024-12-17 ENCOUNTER — Ambulatory Visit (HOSPITAL_BASED_OUTPATIENT_CLINIC_OR_DEPARTMENT_OTHER): Attending: Orthopedic Surgery | Admitting: Physical Therapy

## 2024-12-19 ENCOUNTER — Ambulatory Visit (HOSPITAL_BASED_OUTPATIENT_CLINIC_OR_DEPARTMENT_OTHER): Admitting: Physical Therapy

## 2024-12-24 ENCOUNTER — Ambulatory Visit (HOSPITAL_BASED_OUTPATIENT_CLINIC_OR_DEPARTMENT_OTHER): Admitting: Physical Therapy

## 2024-12-26 ENCOUNTER — Ambulatory Visit (HOSPITAL_BASED_OUTPATIENT_CLINIC_OR_DEPARTMENT_OTHER): Admitting: Physical Therapy

## 2024-12-31 ENCOUNTER — Ambulatory Visit (HOSPITAL_BASED_OUTPATIENT_CLINIC_OR_DEPARTMENT_OTHER): Admitting: Physical Therapy

## 2025-01-02 ENCOUNTER — Ambulatory Visit (HOSPITAL_BASED_OUTPATIENT_CLINIC_OR_DEPARTMENT_OTHER): Admitting: Physical Therapy

## 2025-01-03 ENCOUNTER — Ambulatory Visit (HOSPITAL_COMMUNITY): Admit: 2025-01-03 | Admitting: Urology

## 2025-01-03 SURGERY — INSERTION, GOLD SEEDS
Anesthesia: Monitor Anesthesia Care

## 2025-01-06 ENCOUNTER — Ambulatory Visit: Admitting: Radiation Oncology
